# Patient Record
Sex: Male | Born: 1935 | Race: White | Hispanic: No | Marital: Married | Smoking: Former smoker
Health system: Southern US, Community
[De-identification: ages and names within clinical notes are randomized; demographics above are authoritative.]

## PROBLEM LIST (undated history)

## (undated) DIAGNOSIS — J9600 Acute respiratory failure, unspecified whether with hypoxia or hypercapnia: Secondary | ICD-10-CM

## (undated) DIAGNOSIS — A449 Bartonellosis, unspecified: Secondary | ICD-10-CM

## (undated) DIAGNOSIS — R739 Hyperglycemia, unspecified: Secondary | ICD-10-CM

## (undated) DIAGNOSIS — H3530 Unspecified macular degeneration: Secondary | ICD-10-CM

## (undated) DIAGNOSIS — Z8546 Personal history of malignant neoplasm of prostate: Secondary | ICD-10-CM

## (undated) DIAGNOSIS — G20 Parkinson's disease: Secondary | ICD-10-CM

## (undated) DIAGNOSIS — M4800 Spinal stenosis, site unspecified: Secondary | ICD-10-CM

## (undated) DIAGNOSIS — C801 Malignant (primary) neoplasm, unspecified: Secondary | ICD-10-CM

## (undated) HISTORY — DX: Hyperglycemia, unspecified: R73.9

## (undated) HISTORY — DX: Spinal stenosis, site unspecified: M48.00

## (undated) HISTORY — PX: TONSILLECTOMY: SUR1361

## (undated) HISTORY — DX: Personal history of malignant neoplasm of prostate: Z85.46

## (undated) HISTORY — DX: Bartonellosis, unspecified: A44.9

## (undated) HISTORY — DX: Unspecified macular degeneration: H35.30

## (undated) HISTORY — PX: APPENDECTOMY: SHX54

---

## 1996-11-27 HISTORY — PX: PROSTATE SURGERY: SHX751

## 1999-07-22 ENCOUNTER — Ambulatory Visit (HOSPITAL_COMMUNITY): Admission: RE | Admit: 1999-07-22 | Discharge: 1999-07-22 | Payer: Self-pay | Admitting: Gastroenterology

## 2002-12-03 ENCOUNTER — Encounter (INDEPENDENT_AMBULATORY_CARE_PROVIDER_SITE_OTHER): Payer: Self-pay | Admitting: *Deleted

## 2002-12-03 ENCOUNTER — Ambulatory Visit (HOSPITAL_COMMUNITY): Admission: RE | Admit: 2002-12-03 | Discharge: 2002-12-03 | Payer: Self-pay | Admitting: Gastroenterology

## 2005-03-10 ENCOUNTER — Ambulatory Visit (HOSPITAL_COMMUNITY): Admission: RE | Admit: 2005-03-10 | Discharge: 2005-03-10 | Payer: Self-pay | Admitting: *Deleted

## 2005-08-25 ENCOUNTER — Encounter: Admission: RE | Admit: 2005-08-25 | Discharge: 2005-08-25 | Payer: Self-pay | Admitting: Family Medicine

## 2005-08-30 ENCOUNTER — Encounter: Admission: RE | Admit: 2005-08-30 | Discharge: 2005-08-30 | Payer: Self-pay | Admitting: Family Medicine

## 2005-09-08 ENCOUNTER — Encounter: Admission: RE | Admit: 2005-09-08 | Discharge: 2005-09-08 | Payer: Self-pay | Admitting: Family Medicine

## 2008-01-16 ENCOUNTER — Encounter (INDEPENDENT_AMBULATORY_CARE_PROVIDER_SITE_OTHER): Payer: Self-pay | Admitting: Gastroenterology

## 2008-01-16 ENCOUNTER — Ambulatory Visit (HOSPITAL_COMMUNITY): Admission: RE | Admit: 2008-01-16 | Discharge: 2008-01-16 | Payer: Self-pay | Admitting: Gastroenterology

## 2009-07-08 ENCOUNTER — Encounter: Admission: RE | Admit: 2009-07-08 | Discharge: 2009-07-08 | Payer: Self-pay | Admitting: Neurology

## 2010-07-23 ENCOUNTER — Encounter: Admission: RE | Admit: 2010-07-23 | Discharge: 2010-07-23 | Payer: Self-pay | Admitting: Family Medicine

## 2010-12-26 ENCOUNTER — Other Ambulatory Visit: Payer: Self-pay | Admitting: Neurology

## 2010-12-26 DIAGNOSIS — M5410 Radiculopathy, site unspecified: Secondary | ICD-10-CM

## 2010-12-29 ENCOUNTER — Ambulatory Visit
Admission: RE | Admit: 2010-12-29 | Discharge: 2010-12-29 | Disposition: A | Discharge status: Home / Self Care | Payer: Medicare Other | Source: Ambulatory Visit | Attending: Neurology | Admitting: Neurology

## 2010-12-29 DIAGNOSIS — M5410 Radiculopathy, site unspecified: Secondary | ICD-10-CM

## 2011-01-06 ENCOUNTER — Other Ambulatory Visit: Payer: Self-pay | Admitting: Neurology

## 2011-01-06 DIAGNOSIS — M5410 Radiculopathy, site unspecified: Secondary | ICD-10-CM

## 2011-01-11 ENCOUNTER — Ambulatory Visit
Admission: RE | Admit: 2011-01-11 | Discharge: 2011-01-11 | Disposition: A | Discharge status: Home / Self Care | Payer: Medicare Other | Source: Ambulatory Visit | Attending: Neurology | Admitting: Neurology

## 2011-01-11 DIAGNOSIS — M5410 Radiculopathy, site unspecified: Secondary | ICD-10-CM

## 2011-04-25 ENCOUNTER — Encounter: Payer: Medicare Other | Admitting: *Deleted

## 2011-05-02 ENCOUNTER — Other Ambulatory Visit: Payer: Self-pay | Admitting: Neurology

## 2011-05-02 DIAGNOSIS — M79609 Pain in unspecified limb: Secondary | ICD-10-CM

## 2011-05-03 ENCOUNTER — Encounter: Payer: Medicare Other | Admitting: *Deleted

## 2011-05-03 ENCOUNTER — Encounter (INDEPENDENT_AMBULATORY_CARE_PROVIDER_SITE_OTHER): Payer: Medicare Other | Admitting: *Deleted

## 2011-05-03 ENCOUNTER — Encounter: Payer: Medicare Other | Admitting: Cardiology

## 2011-05-03 DIAGNOSIS — M79609 Pain in unspecified limb: Secondary | ICD-10-CM

## 2011-05-03 DIAGNOSIS — I739 Peripheral vascular disease, unspecified: Secondary | ICD-10-CM

## 2011-05-08 ENCOUNTER — Encounter: Payer: Self-pay | Admitting: Neurology

## 2011-08-03 ENCOUNTER — Other Ambulatory Visit: Payer: Self-pay | Admitting: Dermatology

## 2011-08-29 ENCOUNTER — Other Ambulatory Visit: Payer: Self-pay | Admitting: Dermatology

## 2011-12-07 ENCOUNTER — Other Ambulatory Visit: Payer: Self-pay | Admitting: Dermatology

## 2011-12-21 ENCOUNTER — Encounter: Payer: Self-pay | Admitting: Cardiovascular Disease

## 2012-02-15 ENCOUNTER — Telehealth: Payer: Self-pay | Admitting: *Deleted

## 2012-02-15 NOTE — Telephone Encounter (Signed)
Raymond Valdez from [facility] called to find out when his appt here will be. They had referred him. I gave her the date & time

## 2012-02-20 ENCOUNTER — Encounter: Payer: Self-pay | Admitting: Internal Medicine

## 2012-02-20 ENCOUNTER — Ambulatory Visit (INDEPENDENT_AMBULATORY_CARE_PROVIDER_SITE_OTHER): Payer: Medicare Other | Admitting: Internal Medicine

## 2012-02-20 VITALS — BP 146/81 | HR 77 | Temp 97.7°F | Wt 190.0 lb

## 2012-02-20 DIAGNOSIS — M4800 Spinal stenosis, site unspecified: Secondary | ICD-10-CM

## 2012-02-20 DIAGNOSIS — Z8546 Personal history of malignant neoplasm of prostate: Secondary | ICD-10-CM

## 2012-02-20 DIAGNOSIS — A449 Bartonellosis, unspecified: Secondary | ICD-10-CM

## 2012-02-20 DIAGNOSIS — H3530 Unspecified macular degeneration: Secondary | ICD-10-CM

## 2012-02-20 LAB — COMPREHENSIVE METABOLIC PANEL
ALT: 23 U/L (ref 0–53)
AST: 15 U/L (ref 0–37)
Albumin: 4 g/dL (ref 3.5–5.2)
Alkaline Phosphatase: 58 U/L (ref 39–117)
BUN: 21 mg/dL (ref 6–23)
CO2: 29 mEq/L (ref 19–32)
Calcium: 8.9 mg/dL (ref 8.4–10.5)
Chloride: 104 mEq/L (ref 96–112)
Creat: 0.95 mg/dL (ref 0.50–1.35)
Glucose, Bld: 104 mg/dL — ABNORMAL HIGH (ref 70–99)
Potassium: 4.4 mEq/L (ref 3.5–5.3)
Sodium: 142 mEq/L (ref 135–145)
Total Bilirubin: 0.6 mg/dL (ref 0.3–1.2)
Total Protein: 6.3 g/dL (ref 6.0–8.3)

## 2012-02-20 LAB — CBC WITH DIFFERENTIAL/PLATELET
Basophils Absolute: 0 K/uL (ref 0.0–0.1)
Basophils Relative: 0 % (ref 0–1)
Eosinophils Absolute: 0.1 K/uL (ref 0.0–0.7)
Eosinophils Relative: 2 % (ref 0–5)
HCT: 47.7 % (ref 39.0–52.0)
Hemoglobin: 15.6 g/dL (ref 13.0–17.0)
Lymphocytes Relative: 25 % (ref 12–46)
Lymphs Abs: 2 K/uL (ref 0.7–4.0)
MCH: 31.3 pg (ref 26.0–34.0)
MCHC: 32.7 g/dL (ref 30.0–36.0)
MCV: 95.6 fL (ref 78.0–100.0)
Monocytes Absolute: 0.4 K/uL (ref 0.1–1.0)
Monocytes Relative: 5 % (ref 3–12)
Neutro Abs: 5.2 K/uL (ref 1.7–7.7)
Neutrophils Relative %: 68 % (ref 43–77)
Platelets: 134 K/uL — ABNORMAL LOW (ref 150–400)
RBC: 4.99 MIL/uL (ref 4.22–5.81)
RDW: 13.8 % (ref 11.5–15.5)
WBC: 7.8 K/uL (ref 4.0–10.5)

## 2012-02-21 ENCOUNTER — Encounter: Payer: Self-pay | Admitting: Internal Medicine

## 2012-02-21 DIAGNOSIS — H3530 Unspecified macular degeneration: Secondary | ICD-10-CM | POA: Insufficient documentation

## 2012-02-21 DIAGNOSIS — A449 Bartonellosis, unspecified: Secondary | ICD-10-CM | POA: Insufficient documentation

## 2012-02-21 DIAGNOSIS — M4800 Spinal stenosis, site unspecified: Secondary | ICD-10-CM | POA: Insufficient documentation

## 2012-02-21 DIAGNOSIS — Z8546 Personal history of malignant neoplasm of prostate: Secondary | ICD-10-CM | POA: Insufficient documentation

## 2012-02-21 LAB — BARTONELLA ANTIBODY PANEL
B henselae IgG: NEGATIVE
B henselae IgM: NEGATIVE

## 2012-02-21 LAB — BARTONELLA ANITBODY PANEL

## 2012-02-21 NOTE — Progress Notes (Signed)
INFECTIOUS DISEASES CLINIC NOTE  RFV: initial visit, bartonellosis  Subjective:    Patient ID: Raymond Valdez, male    DOB: Apr 23, 1929, 78 y.o.   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 roughly 2 yrs ago, after having a tick bit to his left wrist. The patient initially had a sizeable local reaction to a tick bite that dissipated spontaneously. He subsequently started to have arthralgia, muscle cramps weeks after his tick bite exposure.He denied having target lesion, EM like rash. He was tested for lyme disease, and bartonellosis by a fellow veterinarian, Dr. Lanora Manis, who does research in bartonella, and found that the patient had bartonella vinsonii berkhoffii, and negative for borrelia burgdorferi.The patient reports that his titers were  Elevated at 1:152, per patient report. He subsequently received various courses of antibiotics, including doxycycline, a type of "mycin", rifampin and a fluoroquinolone by his former PCP ,Dara Hoyer. After receiving treatment, he had follow up testing (blood work on 2/23, 2/24, and 2/25) which were" negative for bartonella by the most sensitive diagnostic testing available through Dr. Breitschwerdt's lab, unclear if it was PCR vs. IFA.  Since being treated over 2 yrs ago, he feels that his health is slowly declining. He is having worsening peripheral neuropathy with numbness to his feet, difficulty walking; ongoing fatigue, and some memory difficulty. The patient is worried that he may still have some sequelae associated with bartonellosis, that would require further treatment. He denies fever/chills/nightsweat/weight loss, but does have a rash that has persisted on his torso, predominantly his chest for over the last 2 months.   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   Past med history: Prostate ca s/p prostatectomy s/p radiation Pre-melanoma skin lesion s/p removal Spinal stenosis Peripheral neuropathy, followed by [facility] Age related macular degeneration  Social history = lives in [place] for greater than [place]yrs. Married. Non-smoker, no alcohol. He uses cane to ambulate/ aid with his peripheral neuropathy. He has 2 dogs, 1 older cat, and 2 parrots-- all in good health.  Family history= reviewed, non-contributory. His father had AV replacement at age of 96, lived to age of 56.   Review of Systems Review of Systems  Constitutional: Negative for fever, chills, diaphoresis, activity change, appetite change, and unexpected weight change. postitve for fatigue HENT: Negative for congestion, sore throat, rhinorrhea, sneezing, trouble swallowing and sinus pressure.  Eyes: Negative for photophobia and visual disturbance. He does suffer from age related macular degeneration Respiratory: Negative for cough, chest tightness, shortness of breath, wheezing and stridor.  Cardiovascular: Negative for chest pain, palpitations and leg swelling.  Gastrointestinal: Negative for nausea, vomiting, abdominal pain, diarrhea, constipation, blood in stool, abdominal distention and anal bleeding.  Genitourinary: Negative for dysuria, hematuria, flank pain and difficulty urinating.  Musculoskeletal: Negative for myalgias, joint swelling, arthralgias. He has gait difficulty due to peripheral neuropathy. occ back pain due to spinal stenosis. Skin: Negative for color change, pallor, rash and wound.  Neurological: Negative for dizziness, tremors, and light-headedness. Mild weakness due to peripheral neuropathy of lower extremities. Numbness/dull ache to lower extremities Hematological: Negative for adenopathy. Does not bruise/bleed easily.    Psychiatric/Behavioral: Negative for behavioral problems, confusion, sleep disturbance, dysphoric mood, decreased concentration  and agitation.       Objective:   Physical Exam BP 146/81  Pulse 77  Temp(Src) 97.7 F (36.5 C) (Oral)  Wt 190 lb (86.183 kg)  General Appearance:    Alert, cooperative, no distress, appears stated age  Head:    Normocephalic, without obvious abnormality, atraumatic  Eyes:    PERRL, conjunctiva/corneas clear, EOM's intact, fundi    benign, both eyes       Ears:    Normal TM's and external ear canals, both ears  Nose:   Nares normal, septum midline, mucosa normal, no drainage   or sinus tenderness  Throat:   Lips, mucosa, and tongue normal; teeth and gums normal  Neck:   Supple, symmetrical, trachea midline, no adenopathy;   Decrease flexion due to spinal stenosis.      Back:     Symmetric, no curvature, ROM normal, no CVA tenderness  Lungs:     Clear to auscultation bilaterally, respirations unlabored  Chest wall:    No tenderness or deformity  Heart:    Regular rate and rhythm, S1 and S2 normal, no murmur, rub   or gallop  Abdomen:     Soft, non-tender, bowel sounds active all four quadrants,    no masses, no hepatomegaly. Possible palpable splenic tip noted below last rib        Extremities:   Extremities normal, atraumatic, no cyanosis or edema  Pulses:   2+ and symmetric all extremities  Skin:   Nonblanching, eryethamatous rash, discreet submm macular lesions, on back and chest. Non-erythematous.  Lymph nodes:   Cervical, supraclavicular, and axillary nodes normal  Neurologic:   CNII-XII intact. Normal strength, sensation and reflexes      throughout         Assessment & Plan:  History of bartonella infection =  --will track down records from PCP.  --Will check cbc with diff, cmp, and bartonella titers. --If they are positive, then will track down records to see what treatment he has received. -- If the tests are negative, we will not  pursue other testing, since it appears that his last set of test in Feb 2011, were negative.   His constellation of symptoms of peripheral neuropathy, memory loss, and fatigue, can often be multifactorial. It appears by report that the patient had adequate treatment for bartonella. If his tests remain positive, we will track down his old records to ensure he had adequate therapy. Presently, I have low suspicion that the patient still has ongoing chronic illness associated with bartonella. He does not have any systemic symptoms of fever/chills/nighstweats to warrant blood culture work up. We will check for bartonella titers to see if he may have had exposure. He reports having bartonella vinsonii. I will check to see if any cross reaction with b.henselae or B.quintana which is the battery that is first checked. Bartonella vinsonii may only be available via research labs. Per report, he was negative based on 3 blood tests in Feb 2011.  Peripheral neuropathy= patient reports being followed by [facility]. Has had abn nerve conduction study, and presumably idiopathic etiology. Currently not on any medications but has tried numerous agents without significant improvement  Memory loss= this,too, is also being followed by [facility]. Patient appears to have good recall of these events over the last 2 yrs. Given his age, I would not be surprised if he does have some age related cognitive disorder. I will defer to his PCP, if the patient warrants neurocognitive testing.  Will contact patient within 2 wks to let him know of test results and what will be next steps.   pcp francis brown at [facility].

## 2012-03-21 ENCOUNTER — Other Ambulatory Visit: Payer: Self-pay | Admitting: Family Medicine

## 2012-03-21 DIAGNOSIS — R600 Localized edema: Secondary | ICD-10-CM

## 2012-03-26 ENCOUNTER — Ambulatory Visit
Admission: RE | Admit: 2012-03-26 | Discharge: 2012-03-26 | Disposition: A | Discharge status: Home / Self Care | Payer: Medicare Other | Source: Ambulatory Visit | Attending: Family Medicine | Admitting: Family Medicine

## 2012-03-26 DIAGNOSIS — R600 Localized edema: Secondary | ICD-10-CM

## 2012-04-05 ENCOUNTER — Encounter: Payer: Self-pay | Admitting: *Deleted

## 2012-04-08 ENCOUNTER — Encounter: Payer: Self-pay | Admitting: Cardiovascular Disease

## 2012-04-08 ENCOUNTER — Ambulatory Visit (INDEPENDENT_AMBULATORY_CARE_PROVIDER_SITE_OTHER): Payer: BLUE CROSS/BLUE SHIELD | Admitting: Cardiovascular Disease

## 2012-04-08 VITALS — BP 118/76 | HR 69 | Wt 189.0 lb

## 2012-04-08 DIAGNOSIS — I4949 Other premature depolarization: Secondary | ICD-10-CM

## 2012-04-08 DIAGNOSIS — I493 Ventricular premature depolarization: Secondary | ICD-10-CM | POA: Insufficient documentation

## 2012-04-08 DIAGNOSIS — H3530 Unspecified macular degeneration: Secondary | ICD-10-CM

## 2012-04-08 DIAGNOSIS — R609 Edema, unspecified: Secondary | ICD-10-CM | POA: Insufficient documentation

## 2012-04-08 NOTE — Assessment & Plan Note (Signed)
Avastin intraocular ok BP not too high

## 2012-04-08 NOTE — Progress Notes (Signed)
Patient ID: Raymond Valdez, male   DOB: [birth year], 78 y.o.   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  Chronic LE edema recently worse.  Had occular avastin RX for retinal problems.  Concerned with CHF.  No previous cardiac problems.  Appearst to have progressive neurologic slowing with tardokinesia and sees Dr Sandria Manly.  Wife of 57 years also notes more shuffling gate Denies chest pain, PND or orthopnea.  No palpitations or syncope.  Has had prostatectomy but no obvious chronic lymphadenopathy.  ECG done in primary office showed one PVC  No clinical arrhythmia.  Compliant with meds for HTN.  No history of renal failure and proteinuria not checked for  ROS: Denies fever, malais, weight loss, blurry vision, decreased visual acuity, cough, sputum, SOB, hemoptysis, pleuritic pain, palpitaitons, heartburn, abdominal pain, melena, lower extremity edema, claudication, or rash.  All other systems reviewed and negative   General: Affect appropriate Elderly kyphotic male HEENT: normal Neck supple with no adenopathy JVP normal no bruits no thyromegaly Lungs clear with no wheezing and good diaphragmatic motion Heart:  S1/S2 no murmur,rub, gallop or click PMI normal Abdomen: benighn, BS positve, no tenderness, no AAA no bruit.  No HSM or HJR Distal pulses intact with no bruits Bilateral varicosities with mild  edema Neuro non-focal but tardokinesia  Skin warm and dry No muscular weakness  Medications Current Outpatient Prescriptions  Medication Sig Dispense Refill  . amLODipine (NORVASC) 2.5 MG tablet Take 1 tablet by mouth Daily.      . beta carotene w/minerals (OCUVITE) tablet Take 1 tablet by mouth daily.      Marland Kitchen doxycycline (VIBRAMYCIN) 100 MG capsule Take 1 tablet by mouth BID times 48H.      . furosemide (LASIX) 20 MG tablet Take 1 tablet by mouth Daily.      Marland Kitchen KLOR-CON M20 20 MEQ tablet Take 1 tablet by mouth as needed.      . Omega-3 Fatty Acids (FISH OIL PO) Take 1 tablet by  mouth daily.        Allergies Sulfa antibiotics and Sulfa drugs cross reactors  Family History: No family history on file.  Social History: History   Social History  . Marital Status: Married    Spouse Name: N/A    Number of Children: N/A  . Years of Education: N/A   Occupational History  . Not on file.   Social History Main Topics  . Smoking status: Former Smoker    Quit date: 11/27/1970  . Smokeless tobacco: Never Used  . Alcohol Use: No  . Drug Use: No  . Sexually Active: Yes -- Male partner(s)   Other Topics Concern  . Not on file   Social History Narrative  . No narrative on file    Electrocardiogram:  Assessment and Plan

## 2012-04-08 NOTE — Assessment & Plan Note (Signed)
Benign and asymptomatic.  In setting of avastin and edema with check echo to assess RV/LV functoin

## 2012-04-08 NOTE — Patient Instructions (Signed)
Your physician recommends that you schedule a follow-up appointment in: AS NEEDED Your physician recommends that you continue on your current medications as directed. Please refer to the Current Medication list given to you today. Your physician has requested that you have an echocardiogram. Echocardiography is a painless test that uses sound waves to create images of your heart. It provides your doctor with information about the size and shape of your heart and how well your heart's chambers and valves are working. This procedure takes approximately one hour. There are no restrictions for this procedure. DX EDEMA Your physician has requested that you have a lower or upper extremity venous duplex. This test is an ultrasound of the veins in the legs or arms. It looks at venous blood flow that carries blood from the heart to the legs or arms. Allow one hour for a Lower Venous exam. Allow thirty minutes for an Upper Venous exam. There are no restrictions or special instructions. LOWER  EXTREMITIES  DX EDEMA

## 2012-04-08 NOTE — Assessment & Plan Note (Signed)
Likely related to venous insuf. And previous prostate surgery.  F/U US venous duplex.  Continue diuretic and consider support hose

## 2012-04-11 ENCOUNTER — Ambulatory Visit (HOSPITAL_COMMUNITY): Payer: Medicare Other | Attending: Cardiology

## 2012-04-11 ENCOUNTER — Other Ambulatory Visit: Payer: Self-pay

## 2012-04-11 DIAGNOSIS — R609 Edema, unspecified: Secondary | ICD-10-CM

## 2012-05-03 ENCOUNTER — Encounter (INDEPENDENT_AMBULATORY_CARE_PROVIDER_SITE_OTHER): Payer: Medicare Other

## 2012-05-03 DIAGNOSIS — R229 Localized swelling, mass and lump, unspecified: Secondary | ICD-10-CM

## 2012-05-03 DIAGNOSIS — R609 Edema, unspecified: Secondary | ICD-10-CM

## 2012-05-27 DIAGNOSIS — G20 Parkinson's disease: Secondary | ICD-10-CM

## 2012-05-27 DIAGNOSIS — G20A1 Parkinson's disease without dyskinesia, without mention of fluctuations: Secondary | ICD-10-CM | POA: Insufficient documentation

## 2012-05-27 HISTORY — DX: Parkinson's disease without dyskinesia, without mention of fluctuations: G20.A1

## 2012-05-27 HISTORY — DX: Parkinson's disease: G20

## 2012-06-10 ENCOUNTER — Encounter (HOSPITAL_COMMUNITY): Payer: Self-pay | Admitting: Emergency Medicine

## 2012-06-10 ENCOUNTER — Inpatient Hospital Stay (HOSPITAL_COMMUNITY)
Admission: EM | Admit: 2012-06-10 | Discharge: 2012-06-15 | DRG: 551 | Disposition: A | Discharge status: Other Acute Inpatient Hospital | Payer: Medicare Other | Attending: Internal Medicine | Admitting: Internal Medicine

## 2012-06-10 ENCOUNTER — Inpatient Hospital Stay (HOSPITAL_COMMUNITY): Payer: Medicare Other

## 2012-06-10 ENCOUNTER — Emergency Department (HOSPITAL_COMMUNITY): Payer: Medicare Other

## 2012-06-10 DIAGNOSIS — M47812 Spondylosis without myelopathy or radiculopathy, cervical region: Secondary | ICD-10-CM | POA: Diagnosis present

## 2012-06-10 DIAGNOSIS — A449 Bartonellosis, unspecified: Secondary | ICD-10-CM | POA: Diagnosis present

## 2012-06-10 DIAGNOSIS — R29898 Other symptoms and signs involving the musculoskeletal system: Secondary | ICD-10-CM | POA: Diagnosis present

## 2012-06-10 DIAGNOSIS — IMO0002 Reserved for concepts with insufficient information to code with codable children: Secondary | ICD-10-CM | POA: Diagnosis present

## 2012-06-10 DIAGNOSIS — G629 Polyneuropathy, unspecified: Secondary | ICD-10-CM | POA: Diagnosis present

## 2012-06-10 DIAGNOSIS — R5381 Other malaise: Secondary | ICD-10-CM

## 2012-06-10 DIAGNOSIS — R531 Weakness: Secondary | ICD-10-CM

## 2012-06-10 DIAGNOSIS — G92 Toxic encephalopathy: Secondary | ICD-10-CM | POA: Diagnosis not present

## 2012-06-10 DIAGNOSIS — M4800 Spinal stenosis, site unspecified: Secondary | ICD-10-CM | POA: Diagnosis present

## 2012-06-10 DIAGNOSIS — G929 Unspecified toxic encephalopathy: Secondary | ICD-10-CM | POA: Diagnosis not present

## 2012-06-10 DIAGNOSIS — R609 Edema, unspecified: Secondary | ICD-10-CM

## 2012-06-10 DIAGNOSIS — Z8546 Personal history of malignant neoplasm of prostate: Secondary | ICD-10-CM

## 2012-06-10 DIAGNOSIS — M48061 Spinal stenosis, lumbar region without neurogenic claudication: Principal | ICD-10-CM | POA: Diagnosis present

## 2012-06-10 DIAGNOSIS — G9340 Encephalopathy, unspecified: Secondary | ICD-10-CM | POA: Diagnosis present

## 2012-06-10 DIAGNOSIS — G609 Hereditary and idiopathic neuropathy, unspecified: Secondary | ICD-10-CM | POA: Diagnosis present

## 2012-06-10 LAB — APTT: aPTT: 36 seconds (ref 24–37)

## 2012-06-10 LAB — URINALYSIS, ROUTINE W REFLEX MICROSCOPIC
Protein, ur: NEGATIVE mg/dL
Specific Gravity, Urine: 1.029 (ref 1.005–1.030)
pH: 5.5 (ref 5.0–8.0)

## 2012-06-10 LAB — CBC WITH DIFFERENTIAL/PLATELET
Basophils Absolute: 0 10*3/uL (ref 0.0–0.1)
Basophils Relative: 0 % (ref 0–1)
Eosinophils Relative: 0 % (ref 0–5)
HCT: 40.9 % (ref 39.0–52.0)
Lymphocytes Relative: 11 % — ABNORMAL LOW (ref 12–46)
MCHC: 34.5 g/dL (ref 30.0–36.0)
Monocytes Absolute: 1.3 10*3/uL — ABNORMAL HIGH (ref 0.1–1.0)
Neutro Abs: 10.6 10*3/uL — ABNORMAL HIGH (ref 1.7–7.7)
Platelets: 140 10*3/uL — ABNORMAL LOW (ref 150–400)
RDW: 14.3 % (ref 11.5–15.5)
WBC: 13.4 10*3/uL — ABNORMAL HIGH (ref 4.0–10.5)

## 2012-06-10 LAB — COMPREHENSIVE METABOLIC PANEL
CO2: 27 mEq/L (ref 19–32)
Calcium: 9 mg/dL (ref 8.4–10.5)
Creatinine, Ser: 0.73 mg/dL (ref 0.50–1.35)
GFR calc Af Amer: >90 mL/min (ref >90)
GFR calc non Af Amer: 85 mL/min — ABNORMAL LOW (ref >90)
Glucose, Bld: 101 mg/dL — ABNORMAL HIGH (ref 70–99)
Total Protein: 6.7 g/dL (ref 6.0–8.3)

## 2012-06-10 LAB — PROTIME-INR: INR: 1.22 (ref 0.00–1.49)

## 2012-06-10 MED ORDER — SODIUM CHLORIDE 0.9 % IJ SOLN
3.0000 mL | Freq: Two times a day (BID) | INTRAMUSCULAR | Status: DC
  Administered 2012-06-10: 3 mL via INTRAVENOUS
  Administered 2012-06-11: 10 mL via INTRAVENOUS
  Administered 2012-06-11 – 2012-06-14 (×7): 3 mL via INTRAVENOUS

## 2012-06-10 MED ORDER — SODIUM CHLORIDE 0.9 % IV SOLN: 250.0000 mL | INTRAVENOUS | Status: DC | PRN

## 2012-06-10 MED ORDER — ONDANSETRON HCL 4 MG/2ML IJ SOLN: 4.0000 mg | Freq: Four times a day (QID) | INTRAMUSCULAR | Status: DC | PRN

## 2012-06-10 MED ORDER — IOHEXOL 300 MG/ML  SOLN
100.0000 mL | Freq: Once | INTRAMUSCULAR | Status: AC | PRN
  Administered 2012-06-10: 100 mL via INTRAVENOUS

## 2012-06-10 MED ORDER — HYDROCODONE-ACETAMINOPHEN 5-325 MG PO TABS: 1.0000 | ORAL_TABLET | ORAL | Status: DC | PRN

## 2012-06-10 MED ORDER — ONDANSETRON HCL 4 MG PO TABS: 4.0000 mg | ORAL_TABLET | Freq: Four times a day (QID) | ORAL | Status: DC | PRN

## 2012-06-10 MED ORDER — OCUVITE PO TABS
1.0000 | ORAL_TABLET | Freq: Every day | ORAL | Status: DC
  Administered 2012-06-10 – 2012-06-14 (×5): 1 via ORAL
  Filled 2012-06-10 (×5): qty 1

## 2012-06-10 MED ORDER — DOCUSATE SODIUM 100 MG PO CAPS
100.0000 mg | ORAL_CAPSULE | Freq: Every day | ORAL | Status: DC | PRN
  Filled 2012-06-10: qty 1

## 2012-06-10 NOTE — ED Notes (Signed)
RN acknowledge vitals 

## 2012-06-10 NOTE — ED Provider Notes (Signed)
History     CSN: [id number]  Arrival date & time 06/10/12  1010   First MD Initiated Contact with Patient 06/10/12 1017       Chief Complaint  Patient presents with  . Fatigue  . Rash    (Consider location/radiation/quality/duration/timing/severity/associated sxs/prior treatment) The history is provided by the patient and the spouse.    78 y/o  Male iNAD c/o worsening generalized weakness over the last several months with increase in the last 24 hours to the point where he cannot ambulate independently. Pt also reports painless, non pruritic rash to torso x 1 year. Pt was diagnosed with an intracellular spp. bartonella vinsonii berkhoffii x2 years ago. Pt was treated with doxy in the last x4 weeks. Pt reports right knee pain which is chronic denies any other pain. Pt feels his current exacerbation is a worsening of this infection. Patient denies chest pain, palpitations, shortness of breath, abdominal pain, fever, change in bowel or bladder habits, HA, neck pain/stiffness.   Past Medical History  Diagnosis Date  . Macular degeneration   . History of prostate cancer   . Infection, bartonella   . Spinal stenosis     Past Surgical History  Procedure Date  . Appendectomy   . Tonsillectomy     History reviewed. No pertinent family history.  History  Substance Use Topics  . Smoking status: Former Smoker    Quit date: 11/27/1970  . Smokeless tobacco: Never Used  . Alcohol Use: No      Review of Systems  Constitutional: Positive for activity change and fatigue. Negative for fever.  HENT: Negative for congestion and rhinorrhea.   Eyes: Negative for visual disturbance.  Cardiovascular: Positive for leg swelling. Negative for chest pain and palpitations.       Chronic dependent edema  Musculoskeletal: Positive for arthralgias.       Right knee pain is chronic  Skin: Positive for rash.  Neurological: Negative for facial asymmetry and numbness.  All other systems reviewed  and are negative.    Allergies  Sulfa antibiotics and Sulfa drugs cross reactors  Home Medications   Current Outpatient Rx  Name Route Sig Dispense Refill  . OCUVITE PO TABS Oral Take 1 tablet by mouth daily.    . FUROSEMIDE 20 MG PO TABS Oral Take 40 mg by mouth Daily.     Marland Kitchen KLOR-CON M20 20 MEQ PO TBCR Oral Take 1 tablet by mouth as needed.    Marland Kitchen FISH OIL PO Oral Take 1 tablet by mouth daily.      BP 116/63  Pulse 90  Temp 98.5 F (36.9 C) (Oral)  Resp 16  SpO2 100%  Physical Exam  Nursing note and vitals reviewed. Constitutional: He is oriented to person, place, and time. He appears well-developed and well-nourished. No distress.  HENT:  Head: Normocephalic and atraumatic.  Right Ear: External ear normal.  Left Ear: External ear normal.  Nose: Nose normal.  Mouth/Throat: Oropharynx is clear and moist.  Eyes: Conjunctivae and EOM are normal. Pupils are equal, round, and reactive to light.  Neck: Normal range of motion. Neck supple. No JVD present. No tracheal deviation present.  Cardiovascular: Normal rate, regular rhythm, normal heart sounds and intact distal pulses.   Pulmonary/Chest: Effort normal and breath sounds normal. No respiratory distress. He has no wheezes. He has no rales. He exhibits no tenderness.  Abdominal: Soft. Bowel sounds are normal. He exhibits no distension and no mass. There is no tenderness. There is no  rebound and no guarding.  Musculoskeletal: He exhibits edema.       1+ pitting edema to bilateral ankles. Joint swelling to bilateral knees, no warmth or ballotable effusion.   Neurological: He is alert and oriented to person, place, and time.       CN 3- 12 intact. Neuro exam is non-focal. Is diminished to 4/5 x4 extremities. Pt has intention tremor x4  Skin: Rash noted. He is not diaphoretic.       Macular rash to anterior and posterior torso.   Psychiatric: He has a normal mood and affect.       Pt is A and O x 3 but seems to to lose his train  of thought mid sentence.     ED Course  Procedures (including critical care time)  Labs Reviewed  CBC WITH DIFFERENTIAL - Abnormal; Notable for the following:    WBC 13.4 (*)     Platelets 140 (*)     Neutrophils Relative 79 (*)     Neutro Abs 10.6 (*)     Lymphocytes Relative 11 (*)     Monocytes Absolute 1.3 (*)     All other components within normal limits  PROTIME-INR - Abnormal; Notable for the following:    Prothrombin Time 15.7 (*)     All other components within normal limits  COMPREHENSIVE METABOLIC PANEL - Abnormal; Notable for the following:    Glucose, Bld 101 (*)     BUN 25 (*)     Albumin 2.8 (*)     AST 70 (*)  ICTERIC SPECIMEN   Total Bilirubin 1.3 (*)     GFR calc non Af Amer 85 (*)     All other components within normal limits  URINALYSIS, ROUTINE W REFLEX MICROSCOPIC - Abnormal; Notable for the following:    Color, Urine AMBER (*)  BIOCHEMICALS MAY BE AFFECTED BY COLOR   APPearance CLOUDY (*)     Ketones, ur TRACE (*)     All other components within normal limits  APTT  LACTIC ACID, PLASMA   Dg Chest 2 View  06/10/2012  *RADIOLOGY REPORT*  Clinical Data: Chest pain.  CHEST - 2 VIEW  Comparison: None.  Findings: Calcified lymph nodes more notable right hilar region suggestive of prior granulomas exposure.  Left base subsegmental atelectasis/scarring suspected.  No infiltrate, congestive heart failure or pneumothorax.  Calcified tortuous aorta.  Heart size within normal limits.  Degenerative changes thoracic spine with mild loss of height lower thoracic vertebra.  This is incompletely assessed on the present exam.  IMPRESSION: Calcified lymph nodes more notable right hilar region suggestive of prior granulomas exposure.  Left base subsegmental atelectasis/scarring suspected.  No infiltrate, congestive heart failure or pneumothorax.  Calcified tortuous aorta.  Original Report Authenticated By: Fuller Canada, M.D.     Date: 06/10/2012  Rate: 88  Rhythm: normal  sinus rhythm  QRS Axis: normal  Intervals: normal  ST/T Wave abnormalities: nonspecific T wave changes  Conduction Disutrbances:right bundle branch block  Narrative Interpretation: PVCs  Old EKG Reviewed: none available    1. Weakness generalized       MDM  78 y/o male with rapidly worsening weakness to the level that he is not able to walk ovr the last 24 hours. Phisocal exam shows diffuse weakness, and jiont swelling. Neuro exam is non-focal with no meningismus noted. CXR, EKG, Blood work and UA show no gross abnormalities.   Infectious disease consult by Dr. Joylene Grapes appreciated who will evaluate  the patient and do not recommend starting antibiotics at this time.   Pt will be admitted for generalized weakness.  Will be admitted to [facility] under the care of Dr. Cena Benton to med/surg. bed.        Wynetta Emery, PA-C 06/10/12 1415

## 2012-06-10 NOTE — Progress Notes (Signed)
Report rec'd from ED RN Dorothyann Peng.  Pt coming to rm [id number].

## 2012-06-10 NOTE — ED Notes (Signed)
ZOX:[id number]<AV> Expected date:06/10/12<BR> Expected time: 9:52 AM<BR> Means of arrival:Ambulance<BR> Comments:<BR> 78yo/m not feeling well

## 2012-06-10 NOTE — ED Notes (Signed)
Patient aware of need for urine specimen. Patient unable to void at this time. Patient given urinal. Encouraged to call for assistance if needed.   

## 2012-06-10 NOTE — Consult Note (Signed)
Date of Admission:  06/10/2012  Date of Consult:  06/10/2012  Reason for Consult: Weakness, Bartonella Referring Physician: Cena Benton  Impression/Recommendation Weakness ? Bartonella infection Would - treat him symptomatically at this point, no anbx Would a LP be of use? skni bx? Have neuro see him, contact Dr Sandria Manly (he is faxing me his records)  Comment-  A brief search of B vinsonii relates this mostly to dog infections. I will contact a colleague at [facility] who is an expert on tick borne infections for help with this. I am not sure his current syndrome relates to this as he appears to have gotten a significant amount of (appropriate) therapy for Bartonella.   Will follow with you, Thank you so much for this interesting consult,   Raymond Valdez [phone]  Raymond Valdez is an 78 y.o. male.  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 He went to a lecture and felt that the syndrome that was being described matched his. 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 He was treated intermittently with doxy (1 month), rifampin (1 month), azithro (1 month) and another anbx (1 month as well). He did not improve.  Over the last several months he has developed a non-pruritic, non-burning, non raised rash.  He has developed a neuropathy in his LE as well (decreased strength and sensation). He was treated with ealvil, neurontin, lyrica (sequentially) without improvement. He was evaluated by neurology (Dr Ileene Rubens). He had a MRI showing cerebellar atrophy, and he has also gotten epidural steroid as well (without relief). Had EMG consistent with radiculopathy and peripheral neuropathy.  1 month ago he traveled to [place] and had a cold afterwards (was treated with a month of doxy prior). He was given anbx for his cold (can't remember name).  Denies tick/insect bites at that time.  Over last 48h he has had increasing weakness. He has needed assistance from cane/walker and family  members.  ROS- no f/c, normal bowel movements, normal urination.   Past Medical History  Diagnosis Date  . Macular degeneration   . History of prostate cancer   . Infection, bartonella   . Spinal stenosis     Past Surgical History  Procedure Date  . Appendectomy   . Tonsillectomy   . Prostate surgery 1998    surgery and then radiation 7 years later in 2005  ergies:   Allergies  Allergen Reactions  . Sulfa Antibiotics Itching and Rash  . Sulfa Drugs Cross Reactors Itching and Rash    Tribrisen, a sulfa potentiator, also causes reaction in patient, as does sulfa    Medications: Scheduled:    Social History:  reports that he quit smoking about 41 years ago. He has never used smokeless tobacco. He reports that he does not drink alcohol or use illicit drugs.  History reviewed. No pertinent family history.  General ROS: see above  Blood pressure 126/70, pulse 76, temperature 100 F (37.8 C), temperature source Oral, resp. rate 16, height 5\' 8"  (1.727 m), weight 80.74 kg (178 lb), SpO2 97.00%. General appearance: alert, cooperative, fatigued and no distress Eyes: negative findings: conjunctivae and sclerae normal and pupils equal, round, reactive to light and accomodation Throat: lips, mucosa, and tongue normal; teeth and gums normal Neck: no adenopathy and thyroid not enlarged, symmetric, no tenderness/mass/nodules Lungs: clear to auscultation bilaterally Heart: regular rate and rhythm Extremities: edema >3+ in LE Neurologic: Cranial nerves: normal Sensory: grossly normal in LE Motor:  normal UE grip and LE plantar. he has remarkable UE intention tremor   Results for orders placed during the hospital encounter of 06/10/12 (from the past 48 hour(s))  CBC WITH DIFFERENTIAL     Status: Abnormal   Collection Time   06/10/12 11:45 AM      Component Value Range Comment   WBC 13.4 (*) 4.0 - 10.5 K/uL    RBC 4.48  4.22 - 5.81 MIL/uL    Hemoglobin 14.1  13.0 - 17.0 g/dL    HCT  16.1  09.6 - 04.5 %    MCV 91.3  78.0 - 100.0 fL    MCH 31.5  26.0 - 34.0 pg    MCHC 34.5  30.0 - 36.0 g/dL    RDW 40.9  81.1 - 91.4 %    Platelets 140 (*) 150 - 400 K/uL    Neutrophils Relative 79 (*) 43 - 77 %    Neutro Abs 10.6 (*) 1.7 - 7.7 K/uL    Lymphocytes Relative 11 (*) 12 - 46 %    Lymphs Abs 1.5  0.7 - 4.0 K/uL    Monocytes Relative 9  3 - 12 %    Monocytes Absolute 1.3 (*) 0.1 - 1.0 K/uL    Eosinophils Relative 0  0 - 5 %    Eosinophils Absolute 0.0  0.0 - 0.7 K/uL    Basophils Relative 0  0 - 1 %    Basophils Absolute 0.0  0.0 - 0.1 K/uL   APTT     Status: Normal   Collection Time   06/10/12 11:45 AM      Component Value Range Comment   aPTT 36  24 - 37 seconds   PROTIME-INR     Status: Abnormal   Collection Time   06/10/12 11:45 AM      Component Value Range Comment   Prothrombin Time 15.7 (*) 11.6 - 15.2 seconds    INR 1.22  0.00 - 1.49   COMPREHENSIVE METABOLIC PANEL     Status: Abnormal   Collection Time   06/10/12 11:45 AM      Component Value Range Comment   Sodium 139  135 - 145 mEq/L    Potassium 4.3  3.5 - 5.1 mEq/L    Chloride 103  96 - 112 mEq/L    CO2 27  19 - 32 mEq/L    Glucose, Bld 101 (*) 70 - 99 mg/dL    BUN 25 (*) 6 - 23 mg/dL    Creatinine, Ser 7.82  0.50 - 1.35 mg/dL    Calcium 9.0  8.4 - 95.6 mg/dL    Total Protein 6.7  6.0 - 8.3 g/dL    Albumin 2.8 (*) 3.5 - 5.2 g/dL    AST 70 (*) 0 - 37 U/L ICTERIC SPECIMEN   ALT 29  0 - 53 U/L    Alkaline Phosphatase 42  39 - 117 U/L    Total Bilirubin 1.3 (*) 0.3 - 1.2 mg/dL    GFR calc non Af Amer 85 (*) >90 mL/min    GFR calc Af Amer >90  >90 mL/min   LACTIC ACID, PLASMA     Status: Normal   Collection Time   06/10/12 12:15 PM      Component Value Range Comment   Lactic Acid, Venous 2.1  0.5 - 2.2 mmol/L   URINALYSIS, ROUTINE W REFLEX MICROSCOPIC     Status: Abnormal   Collection Time   06/10/12 12:38 PM  Component Value Range Comment   Color, Urine AMBER (*) YELLOW BIOCHEMICALS MAY BE  AFFECTED BY COLOR   APPearance CLOUDY (*) CLEAR    Specific Gravity, Urine 1.029  1.005 - 1.030    pH 5.5  5.0 - 8.0    Glucose, UA NEGATIVE  NEGATIVE mg/dL    Hgb urine dipstick NEGATIVE  NEGATIVE    Bilirubin Urine NEGATIVE  NEGATIVE    Ketones, ur TRACE (*) NEGATIVE mg/dL    Protein, ur NEGATIVE  NEGATIVE mg/dL    Urobilinogen, UA 1.0  0.0 - 1.0 mg/dL    Nitrite NEGATIVE  NEGATIVE    Leukocytes, UA NEGATIVE  NEGATIVE MICROSCOPIC NOT DONE ON URINES WITH NEGATIVE PROTEIN, BLOOD, LEUKOCYTES, NITRITE, OR GLUCOSE <1000 mg/dL.   No results found for this basename: sdes, specrequest, cult, reptstatus   Dg Chest 2 View  06/10/2012  *RADIOLOGY REPORT*  Clinical Data: Chest pain.  CHEST - 2 VIEW  Comparison: None.  Findings: Calcified lymph nodes more notable right hilar region suggestive of prior granulomas exposure.  Left base subsegmental atelectasis/scarring suspected.  No infiltrate, congestive heart failure or pneumothorax.  Calcified tortuous aorta.  Heart size within normal limits.  Degenerative changes thoracic spine with mild loss of height lower thoracic vertebra.  This is incompletely assessed on the present exam.  IMPRESSION: Calcified lymph nodes more notable right hilar region suggestive of prior granulomas exposure.  Left base subsegmental atelectasis/scarring suspected.  No infiltrate, congestive heart failure or pneumothorax.  Calcified tortuous aorta.  Original Report Authenticated By: Fuller Canada, M.D.   No results found for this or any previous visit (from the past 240 hour(s)).    06/10/2012, 4:34 PM     LOS: 0 days

## 2012-06-10 NOTE — ED Notes (Signed)
Patience requesting a meal RN notified

## 2012-06-10 NOTE — ED Provider Notes (Signed)
History     CSN: [id number]  Arrival date & time 06/10/12  1010   First MD Initiated Contact with Patient 06/10/12 1017      Chief Complaint  Patient presents with  . Fatigue  . Rash    (Consider location/radiation/quality/duration/timing/severity/associated sxs/prior treatment) HPI Comments: Patient presents with generalized weakness and joint pains. He has a complicated history and was treated for a version of lyme disease 2 years ago. 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 He [facility]. He currently works as a International aid/development worker. At that time 2 years ago he presented with a rash as well as fatigue and joint swelling. In March of this year he was noticing a recurrence of his symptoms since on infectious disease specialists here at [facility]. He noted that 2 months ago symptoms started progressing with increased joint swelling and fatigue. His Lyme titers were drawn and are currently awaiting final testing through the [facility]. He did complete a one-month cycle of doxycycline which she finished in May. He presents today with markedly worse joint swelling and joint pains as well as increased fatigue. Over the weekend he started to the point where he can't set up on his own and cannot ambulate. He denies any headache or neck pain or stiffness denies a fevers or chills. He has some chronic ongoing low back pain but denies any current back pain. He does have the rash which was consistent with his past Lyme rash which gone on for about the last year off and on  The history is provided by the patient.    Past Medical History  Diagnosis Date  . Macular degeneration   . History of prostate cancer   . Infection, bartonella   . Spinal stenosis     Past Surgical History  Procedure Date  . Appendectomy   . Tonsillectomy   . Prostate surgery 1998    surgery and then radiation 7 years later in 2005    History reviewed.  No pertinent family history.  History  Substance Use Topics  . Smoking status: Former Smoker    Quit date: 11/27/1970  . Smokeless tobacco: Never Used  . Alcohol Use: No      Review of Systems  Constitutional: Positive for fatigue. Negative for fever, chills and diaphoresis.  HENT: Negative for congestion, rhinorrhea and sneezing.   Eyes: Negative.  Negative for photophobia and visual disturbance.  Respiratory: Negative for cough, chest tightness and shortness of breath.   Cardiovascular: Negative for chest pain and leg swelling.  Gastrointestinal: Negative for nausea, vomiting, abdominal pain, diarrhea and blood in stool.  Genitourinary: Negative for frequency, hematuria, flank pain and difficulty urinating.  Musculoskeletal: Positive for joint swelling, arthralgias and gait problem. Negative for back pain.  Skin: Positive for rash.  Neurological: Positive for tremors (chronic and at basline) and numbness (has chronic neuropathy to left leg since original lyme dx). Negative for dizziness, speech difficulty, weakness and headaches.    Allergies  Sulfa antibiotics and Sulfa drugs cross reactors  Home Medications   No current outpatient prescriptions on file.  BP 144/82  Pulse 76  Temp 98.8 F (37.1 C) (Oral)  Resp 16  Ht 5\' 8"  (1.727 m)  Wt 178 lb (80.74 kg)  BMI 27.06 kg/m2  SpO2 94%  Physical Exam  Constitutional: He is oriented to person, place, and time. He appears well-developed and well-nourished.  HENT:  Head: Normocephalic and atraumatic.  Eyes: Pupils  are equal, round, and reactive to light.  Neck: Normal range of motion. Neck supple.  Cardiovascular: Normal rate, regular rhythm and normal heart sounds.   Pulmonary/Chest: Effort normal and breath sounds normal. No respiratory distress. He has no wheezes. He has no rales. He exhibits no tenderness.  Abdominal: Soft. Bowel sounds are normal. There is no tenderness. There is no rebound and no guarding.    Musculoskeletal: Normal range of motion. He exhibits edema and tenderness.  Lymphadenopathy:    He has no cervical adenopathy.  Neurological: He is alert and oriented to person, place, and time. No cranial nerve deficit.       Pt is oriented, but has difficulty focusing on conversation and has tangential thinking, has generalized weakness.  No focal deficits noted.  No facial drooping or slurred speech  Skin: Skin is warm and dry. No rash noted.  Psychiatric: He has a normal mood and affect.    ED Course  Procedures (including critical care time)  Labs Reviewed  CBC WITH DIFFERENTIAL - Abnormal; Notable for the following:    WBC 13.4 (*)     Platelets 140 (*)     Neutrophils Relative 79 (*)     Neutro Abs 10.6 (*)     Lymphocytes Relative 11 (*)     Monocytes Absolute 1.3 (*)     All other components within normal limits  PROTIME-INR - Abnormal; Notable for the following:    Prothrombin Time 15.7 (*)     All other components within normal limits  COMPREHENSIVE METABOLIC PANEL - Abnormal; Notable for the following:    Glucose, Bld 101 (*)     BUN 25 (*)     Albumin 2.8 (*)     AST 70 (*)  ICTERIC SPECIMEN   Total Bilirubin 1.3 (*)     GFR calc non Af Amer 85 (*)     All other components within normal limits  URINALYSIS, ROUTINE W REFLEX MICROSCOPIC - Abnormal; Notable for the following:    Color, Urine AMBER (*)  BIOCHEMICALS MAY BE AFFECTED BY COLOR   APPearance CLOUDY (*)     Ketones, ur TRACE (*)     All other components within normal limits  CBC - Abnormal; Notable for the following:    WBC 11.3 (*)     RBC 3.97 (*)     Hemoglobin 11.9 (*)     HCT 36.5 (*)     Platelets 128 (*)     All other components within normal limits  BASIC METABOLIC PANEL - Abnormal; Notable for the following:    Glucose, Bld 109 (*)     GFR calc non Af Amer 81 (*)     All other components within normal limits  SEDIMENTATION RATE - Abnormal; Notable for the following:    Sed Rate 66 (*)      All other components within normal limits  APTT  LACTIC ACID, PLASMA  VITAMIN B12  ANA  CK  VITAMIN D 25 HYDROXY  MISCELLANEOUS TEST  VITAMIN B1  FOLATE RBC  VITAMIN D 1,25 DIHYDROXY  COMPREHENSIVE METABOLIC PANEL  RPR  TSH   Mr Cervical Spine Wo Contrast  06/13/2012  *RADIOLOGY REPORT*  Clinical Data: Bilateral lower extremity weakness.  Recent bacterial infection.  History prostate cancer.  MRI CERVICAL SPINE WITHOUT CONTRAST  Technique:  Multiplanar and multiecho pulse sequences of the cervical spine, to include the craniocervical junction and cervicothoracic junction, were obtained according to standard protocol without intravenous contrast.  Comparison: 07/08/2009 MR brain.  No comparison cervical spine MR.  Findings: Cervical medullary junction unremarkable.  No focal cervical cord signal abnormality.  Fluid noted within the C1 lateral mass - C2 articulation.  Edema within the adjacent posterior soft tissues/paraspinal muscles are more notable on the left.  Given the patient's history of recent bacterial infection, infection of the joint space and surrounding soft tissue not excluded.  This can be re-evaluated with close follow-up if there are progressive symptoms.  The bony structures appear intact.  No other findings to suggest diskitis/osteomyelitis.  C2-3:  Minimal bulge greater to the left.  Minimal left foraminal narrowing. Right facet joint may be fused.  C3-4: Small spur greater to the left.  Mild left-sided cord contact.  Mild left foraminal narrowing.  C4-5:  Broad-based disc osteophyte complex greater to the left. Minimal left-sided cord flattening.  Uncinate spurring.  Mild to slightly moderate bilateral foraminal narrowing.  C5-6:  Small broad-based disc osteophyte greater to the left. Minimal left-sided cord flattening.  Uncinate spurring.  Moderate bilateral foraminal narrowing.  C6-7:  Small broad-based disc osteophyte greater to the right.  No significant cord flattening.   Uncinate spurring.  Moderate bilateral foraminal narrowing.  C7-T1:  Minimal bulge.  IMPRESSION:  Fluid noted within the C1 lateral mass - C2 articulation.  Edema within the adjacent posterior soft tissues/paraspinal muscles are more notable on the left.  Given the patient's history of recent bacterial infection, infection of the joint space and surrounding soft tissue not excluded.  Mild to slightly moderate degenerative changes most notable C4-5 through C6-7 as detailed above.  This has been made a PRA call report utilizing dashboard call feature.  Original Report Authenticated By: Fuller Canada, M.D.     1. Weakness generalized   2. Infection, bartonella   3. Lower extremity weakness   4. Spinal stenosis   5. Edema   6. History of prostate cancer       MDM  Will check labs chest x-ray, patient will need admission and infectious disease consult. He does have some difficulty with thought process but no other symptoms to suggest meningitis or encephalitis.        Rolan Bucco, MD 06/13/12 1131

## 2012-06-10 NOTE — ED Notes (Signed)
Pt states he had Bartonella visconni berkoffia (Lyme disease) 2 years ago.  Pt was treated and tested negative afterwords.  Per pt, this form of lyme disease does not show up on regular lyme disease test.  Pt had test drawn back in May, but results still pending.  Pt states he has been having generalized weakness and fatigue to the point he is unable to get up out of bed.  Normally able to get up and walk normally.  Pt also has generalized red rash on chest with no itching, that pt states he had last time he had lyme disease.  Also arthritis pain in knees worse, which he says also happened 2 years ago.  Denies N/V/D or sob.  Pt alert and oriented

## 2012-06-10 NOTE — Consult Note (Signed)
[facility] CONSULT NOTE     Reason for Consult: Progressive weakness, primarily lower extremities.    HPI:    Raymond Valdez is an 78 y.o. male with a history of prostate cancer, lumbar spinal stenosis and chronic infection with Bartonella vinsonii and about 2 years. He's been undergoing intermittent antibiotic treatment with doxycycline over the past couple of years. The last treatment was approximately 4-6 weeks ago. He's been experiencing progressive weakness primarily involving his lower extremities for about 2 years. Weakness appears to have accelerated over the past 2 weeks. He has become progressively weak to the point that he is unable to stand without assistance and unable to ambulate at this point. She has chronic urinary incontinence which was postop sequela of prostate cancer. He said no problems with bowel control. He has chronic peripheral neuropathy and has been a patient of Dr. Fayrene Fearing love with [facility]. Etiology for peripheral neuropathy is not clear. As the Propp as experienced progressive weakness of his hands over the past year. He's been involved with physical therapy including strengthening of the neck muscles in addition to extremity muscles. Vitamin B12 level is pending. ANA is also pending. Lumbosacral MRI was performed today. Results pending.  Past Medical History  Diagnosis Date  . Macular degeneration   . History of prostate cancer   . Infection, bartonella   . Spinal stenosis     Past Surgical History  Procedure Date  . Appendectomy   . Tonsillectomy   . Prostate surgery 1998    surgery and then radiation 7 years later in 2005    History reviewed. No pertinent family history.  Social History:  reports that he quit smoking about 41 years ago. He has never used smokeless tobacco. He reports that he does not drink alcohol or use illicit drugs.  Allergies  Allergen Reactions  . Sulfa Antibiotics  Itching and Rash  . Sulfa Drugs Cross Reactors Itching and Rash    Tribrisen, a sulfa potentiator, also causes reaction in patient, as does sulfa    Medications:    Scheduled:   . beta carotene w/minerals  1 tablet Oral Daily  . sodium chloride  3 mL Intravenous Q12H   ZOX:WRUEAV chloride, docusate sodium, HYDROcodone-acetaminophen, iohexol, ondansetron (ZOFRAN) IV, ondansetron   Blood pressure 126/70, pulse 76, temperature 100 F (37.8 C), temperature source Oral, resp. rate 16, height 5\' 8"  (1.727 m), weight 80.74 kg (178 lb), SpO2 97.00%.   Neurologic Examination:  Mental Status: Alert, oriented, thought content appropriate.  Speech fluent without evidence of aphasia. Able to follow commands without difficulty. Cranial Nerves: II-Visual fields were normal. III/IV/VI-Pupils were equal and reacted. Extraocular movements were full and conjugate.    V/VII-no facial numbness and no facial weakness. VIII-normal. X-normal speech and symmetrical palatal movement. XII-midline tongue extension Motor: 4/5 neck flexors strength; 5/5 neck extensors strength; 5/5 right and left biceps strength; 4/5 triceps and rotators of the arms bilaterally; 4+ over 5 wrist extensors; 4/5 right and left first DI and ADQ muscles; 3/5 right hip flexors and 2/5 left hip flexors; 3/5 right and left quadriceps and hamstrings; 5-/5 tibialis anterior muscles bilaterally; 5/5 plantar flexors of the feet. Muscle tone was flaccid throughout. No fasciculations are noted. Sensory: Markedly reduced vibratory sensation distally in both lower extremities, as well as reduced tactile sensation distally. Deep Tendon Reflexes: 2+ and brisk in  upper extremities; 1+ and symmetric at knees and ankles.  Plantars: Mute bilaterally Cerebellar: Moderately impaired with bilateral intention tremor of upper extremities. Carotid auscultation: Normal    Assessment/Plan:  1. Progressive diffuse muscle weakness with primarily  involvement of lower extremities. Etiology is unclear at this point. 2. Peripheral neuropathy and lumbar spinal stenosis exacerbating #1.  3. Bertonella vinsonii infection - unclear significance. 4. GBS is unlikely, given the extent of patient's muscle weakness and lower extremity deep tendon reflexes remaining intact, as well as chronicity of patient's progressive weakness.  Recommendations: 1. Sedimentation rate 2. Total CK level 3. Agree with obtaining vitamin B12 level 4. Folate level 5. Vitamin D level 6. No indications for lumbar puncture 7. Cervical spine MRI study without contrast 8. Continue physical therapy intervention  C.R. Roseanne Reno, MD [facility] [phone]  06/10/2012, 9:07 PM

## 2012-06-10 NOTE — H&P (Addendum)
[facility] History and Physical  Raymond Valdez ZOX:[id number] DOB: 02-18-1929 DOA: 06/10/2012  Referring physician: Dr. Fredderick Phenix (ED MD) PCP: Redmond Baseman, MD   Chief Complaint: Progressive BL lower extremity weakness  HPI:  78 y/o CM with h/o prostate cancer s/p prostate surgery (which has left patient incontinent), with history of spinal stenosis, and prior infection with Bartonella vinsonii ~ 2 years ago of which he has had treatment with antibiotics on and off with doxycycline reportedly.  His most recent cycle of doxycycline was finished in May.  He reports that his decrease in strength BL at lower extremities has been progressively worse especially within the last 2 weeks and he is at the point where he can not ambulate and has required help from his grandson to get in and out of bed.  Two weeks ago patient reportedly had an upper respiratory infection which he reports he was prescribed antibiotics for.    On history patient mentions that he had Lyme titers drawn and he is awaiting results and this was [facility].  He denies any recent bouts of diarrhea.  While in the ED patient was evaluated and chest x ray was obtained which was negative for infiltrate, congestive heart failure, or pneumothorax.  Initial lab work up showed a elevated wbc of 13.4, platelets of 140, lactic acid level of 2.1 (normal),.  We were asked to admit patient for further evaluation and recommendations given his worsening BL extremity weakness  Review of Systems:  Patient endorses tremor (new onset ~ 2 months), difficulty "obtaining right words," BL lower extremity weakness, urinary incontinence (which has been present since after his prostate surgery) and rash.  Denies any diarrhea, nausea, abdominal discomfort, fevers, chills, HA's, BRBPR, paresthesias.  Past Medical History  Diagnosis Date  . Macular degeneration   . History of prostate cancer   . Infection,  bartonella   . Spinal stenosis    Past Surgical History  Procedure Date  . Appendectomy   . Tonsillectomy   . Prostate surgery 1998    surgery and then radiation 7 years later in 2005   Social History:  reports that he quit smoking about 41 years ago. He has never used smokeless tobacco. He reports that he does not drink alcohol or use illicit drugs.  where does patient live--home with wife Can't participate in ADLs due to weakness  Allergies  Allergen Reactions  . Sulfa Antibiotics Itching and Rash  . Sulfa Drugs Cross Reactors Itching and Rash    Tribrisen, a sulfa potentiator, also causes reaction in patient, as does sulfa    History reviewed. No pertinent family history. Denies any family history related to vasculitis.  Prior to Admission medications   Medication Sig Start Date End Date Taking? Authorizing Provider  beta carotene w/minerals (OCUVITE) tablet Take 1 tablet by mouth daily.   Yes Historical Provider, MD  furosemide (LASIX) 20 MG tablet Take 40 mg by mouth Daily.  04/06/12  Yes Historical Provider, MD  KLOR-CON M20 20 MEQ tablet Take 1 tablet by mouth as needed. 03/14/12  Yes Historical Provider, MD  Omega-3 Fatty Acids (FISH OIL PO) Take 1 tablet by mouth daily.   Yes Historical Provider, MD   Physical Exam: Filed Vitals:   06/10/12 1025 06/10/12 1046 06/10/12 1303  BP: 116/63 116/63 127/53  Pulse: 90  86  Temp: 98.5 F (36.9 C) 98.5 F (36.9 C) 97.8 F (36.6 C)  TempSrc: Oral Oral Oral  Resp: 16 16 18  SpO2: 100% 100%      General:  Patient is alert and oriented x 3  Eyes: PERRLA, EOMI  ENT: No visible masses on examination  Neck: Supple  Cardiovascular: RRR with periodic ectopic beats, No murmurs  Respiratory: Clear to auscultation, no wheezes  Abdomen: Soft, NT, ND  Skin: Macular rash predominantly on trunk  Musculoskeletal: No pain on palpation over lower extremities  Psychiatric: Mood and affect appropriate  Neurologic: Has tremor,  suspicious for mask like facies, Decrease strength BL at lower extremities.  Reflexes seemed diminished at lower extremities BL when compared to Upper extremities although patient was a difficult exam.  Equal strength with flexion of toes BL  Labs on Admission:  Basic Metabolic Panel:  Lab 06/10/12 8413  NA 139  K 4.3  CL 103  CO2 27  GLUCOSE 101*  BUN 25*  CREATININE 0.73  CALCIUM 9.0  MG --  PHOS --   Liver Function Tests:  Lab 06/10/12 1145  AST 70*  ALT 29  ALKPHOS 42  BILITOT 1.3*  PROT 6.7  ALBUMIN 2.8*   No results found for this basename: LIPASE:5,AMYLASE:5 in the last 168 hours No results found for this basename: AMMONIA:5 in the last 168 hours CBC:  Lab 06/10/12 1145  WBC 13.4*  NEUTROABS 10.6*  HGB 14.1  HCT 40.9  MCV 91.3  PLT 140*   Cardiac Enzymes: No results found for this basename: CKTOTAL:5,CKMB:5,CKMBINDEX:5,TROPONINI:5 in the last 168 hours  BNP (last 3 results) No results found for this basename: PROBNP:3 in the last 8760 hours CBG: No results found for this basename: GLUCAP:5 in the last 168 hours  Radiological Exams on Admission: Dg Chest 2 View  06/10/2012  *RADIOLOGY REPORT*  Clinical Data: Chest pain.  CHEST - 2 VIEW  Comparison: None.  Findings: Calcified lymph nodes more notable right hilar region suggestive of prior granulomas exposure.  Left base subsegmental atelectasis/scarring suspected.  No infiltrate, congestive heart failure or pneumothorax.  Calcified tortuous aorta.  Heart size within normal limits.  Degenerative changes thoracic spine with mild loss of height lower thoracic vertebra.  This is incompletely assessed on the present exam.  IMPRESSION: Calcified lymph nodes more notable right hilar region suggestive of prior granulomas exposure.  Left base subsegmental atelectasis/scarring suspected.  No infiltrate, congestive heart failure or pneumothorax.  Calcified tortuous aorta.  Original Report Authenticated By: Fuller Canada,  M.D.    EKG: Independently reviewed. Sinus rhythm with PVC's no ST elevation or depression.  Assessment/Plan Active Problems: Lower extremity weakness H/o spinal stenosis H/o infection with Bartonella vinsonii    1. Lower extremity weakness - In the setting of history of bartonella vinsonii infection, h/o spinal stenosis, and Recent upper respiratory infection. - Given recent history of recent upper respiratory infection with BL lower extremities involvement we should also consider Guillain-Barre syndrome and as such would consider obtaining CSF for further evaluation with typical findings being elevated CSF protein but CSF cell count typically normal < 5 cells/mm3.  (Will defer decision to collect CSF to ID/Neurology as there may be other CSF components which they may want as part of their differential)   I will go ahead and order nerve conduction studies, Antibodies against GQ1b if available, and await neurology evaluation - Consult Neurology - Will also order other lab work to analyze for other possible diagnosis like (severe vitamin B1 deficiency, vitamin B12, ANA (checking for vasculitis)) will not check serologic test for campylobacter since patient has not had any reports of  diarrhea - Patient does have history of spinal stenosis and given that he has incontinence after prostate surgery would consider obtaining lumbar MRI.  - Physical therapy to eval and treat  2. H/o Bartonella vinsonii infection - Included in the differential would be lyme disease and agree with ID consult.  As such am very interested in their input currently.  Patient with rash and neurological symptoms which may be related and contributing to Number 1  For other plans please refer to orders   Code Status: full Family Communication: Spoke with wife, patient, and grandson at bedside. Disposition Plan: Pending clinical improvement in condition  Penny Pia [facility] Pager [phone]  If 7PM-7AM,  please contact night-coverage [email or web address] Password [email or web address] 06/10/2012, 3:19 PM   Addendum > 70 minutes medical decision making, physical exam, obtaining history, documenting, placing orders.

## 2012-06-11 LAB — BASIC METABOLIC PANEL
Chloride: 103 mEq/L (ref 96–112)
Creatinine, Ser: 0.82 mg/dL (ref 0.50–1.35)
GFR calc Af Amer: >90 mL/min (ref >90)
GFR calc non Af Amer: 81 mL/min — ABNORMAL LOW (ref >90)
Potassium: 3.6 mEq/L (ref 3.5–5.1)

## 2012-06-11 LAB — CBC
MCHC: 32.6 g/dL (ref 30.0–36.0)
Platelets: 128 10*3/uL — ABNORMAL LOW (ref 150–400)
RDW: 14.4 % (ref 11.5–15.5)
WBC: 11.3 10*3/uL — ABNORMAL HIGH (ref 4.0–10.5)

## 2012-06-11 LAB — ANA: Anti Nuclear Antibody(ANA): NEGATIVE

## 2012-06-11 MED ORDER — ACETAMINOPHEN 325 MG PO TABS: 650.0000 mg | ORAL_TABLET | Freq: Four times a day (QID) | ORAL | Status: DC | PRN

## 2012-06-11 MED ORDER — MELOXICAM 7.5 MG PO TABS
7.5000 mg | ORAL_TABLET | Freq: Every day | ORAL | Status: DC
  Administered 2012-06-11 – 2012-06-14 (×4): 7.5 mg via ORAL
  Filled 2012-06-11 (×4): qty 1

## 2012-06-11 MED ORDER — POLYVINYL ALCOHOL 1.4 % OP SOLN
1.0000 [drp] | OPHTHALMIC | Status: DC | PRN
  Administered 2012-06-11 (×2): 1 [drp] via OPHTHALMIC
  Filled 2012-06-11: qty 15

## 2012-06-11 NOTE — Progress Notes (Signed)
INFECTIOUS DISEASE PROGRESS NOTE  ID: Raymond Valdez is a 78 y.o. male with   Principal Problem:  *Lower extremity weakness Active Problems:  History of prostate cancer  Infection, bartonella  Spinal stenosis  Subjective: Feels slightly better.   Abtx:  Anti-infectives    None      Medications:  Scheduled:   . beta carotene w/minerals  1 tablet Oral Daily  . meloxicam  7.5 mg Oral Daily  . sodium chloride  3 mL Intravenous Q12H    Objective: Vital signs in last 24 hours: Temp:  [99.3 F (37.4 C)-100 F (37.8 C)] 99.3 F (37.4 C) (07/16 0623) Pulse Rate:  [76-82] 82  (07/16 0623) Resp:  [16-18] 18  (07/16 0623) BP: (112-126)/(56-74) 124/74 mmHg (07/16 0623) SpO2:  [95 %-97 %] 95 % (07/16 0623) Weight:  [80.74 kg (178 lb)] 80.74 kg (178 lb) (07/15 1546)   General appearance: alert, cooperative, fatigued and no distress Resp: clear to auscultation bilaterally Cardio: regular rate and rhythm GI: normal findings: bowel sounds normal and soft, non-tender  Lab Results  Basename 06/11/12 0330 06/10/12 1145  WBC 11.3* 13.4*  HGB 11.9* 14.1  HCT 36.5* 40.9  NA 138 139  K 3.6 4.3  CL 103 103  CO2 27 27  BUN 22 25*  CREATININE 0.82 0.73  GLU -- --   Liver Panel  Basename 06/10/12 1145  PROT 6.7  ALBUMIN 2.8*  AST 70*  ALT 29  ALKPHOS 42  BILITOT 1.3*  BILIDIR --  IBILI --   Sedimentation Rate No results found for this basename: ESRSEDRATE in the last 72 hours C-Reactive Protein No results found for this basename: CRP:2 in the last 72 hours  Microbiology: No results found for this or any previous visit (from the past 240 hour(s)).  Studies/Results: Dg Chest 2 View  06/10/2012  *RADIOLOGY REPORT*  Clinical Data: Chest pain.  CHEST - 2 VIEW  Comparison: None.  Findings: Calcified lymph nodes more notable right hilar region suggestive of prior granulomas exposure.  Left base subsegmental atelectasis/scarring suspected.  No infiltrate, congestive heart  failure or pneumothorax.  Calcified tortuous aorta.  Heart size within normal limits.  Degenerative changes thoracic spine with mild loss of height lower thoracic vertebra.  This is incompletely assessed on the present exam.  IMPRESSION: Calcified lymph nodes more notable right hilar region suggestive of prior granulomas exposure.  Left base subsegmental atelectasis/scarring suspected.  No infiltrate, congestive heart failure or pneumothorax.  Calcified tortuous aorta.  Original Report Authenticated By: Fuller Canada, M.D.   Ct Head W Wo Contrast  06/10/2012  *RADIOLOGY REPORT*  Clinical Data: Headache.  Stiff neck.  Bilateral lower extremity weakness.  CT HEAD WITHOUT AND WITH CONTRAST  Technique:  Contiguous axial images were obtained from the base of the skull through the vertex without and with intravenous contrast.  Contrast: OMNIPAQUE IOHEXOL 300 MG/ML  SOLN  Comparison: MRI of the brain dated 07/08/2009  Findings: There is no acute intracranial hemorrhage, infarction, or mass lesion.  There is diffuse cerebral cortical and cerebellar atrophy. Brain parenchyma is otherwise normal.  No osseous abnormality.  No ventricular dilatation.  IMPRESSION: No acute intracranial abnormality.  Diffuse atrophy.  No pathologic enhancement after contrast administration.  Original Report Authenticated By: Gwynn Burly, M.D.   Mr Lumbar Spine Wo Contrast  06/11/2012  *RADIOLOGY REPORT*  Clinical Data: Progressive bilateral leg weakness.  MRI LUMBAR SPINE WITHOUT CONTRAST  Technique:  Multiplanar and multiecho pulse sequences of  the lumbar spine were obtained without intravenous contrast.  Comparison: Lumbar MRI 07/23/2010  Findings: Normal lumbar alignment.  Negative for fracture. Moderately large hemangioma in the L1 vertebral body on the right is unchanged.  Heterogeneous bone marrow signal is present but without focal mass lesion.  Conus medullaris is normal and terminates at L1.  T12-L1:  Negative  L1-2:   Mild disc degeneration  L2-3:  Disc degeneration and disc space narrowing with early vertebral spurring.  Mild facet degeneration with mild narrowing of the spinal canal.  L3-4:  Disc degeneration and spondylosis.  Right-sided disc bulging is present.  Mild facet degeneration.  Mild spinal stenosis.  Mild right foraminal stenosis.  L4-5:  Disc bulging and spurring.  Bilateral facet hypertrophy with mild spinal stenosis.  L5-S1:  Mild disc degeneration.  Bilateral facet degeneration with facet joint hypertrophy causing mild foraminal stenosis on the right and moderate foraminal stenosis on the left.  IMPRESSION: Negative for fracture.  Negative for critical spinal stenosis.  Lumbar degenerative changes are present as described above.  There is right foraminal narrowing at L3-4.  Mild spinal stenosis at L4- 5.  Foraminal stenosis at L5-S1, left greater than right.  Original Report Authenticated By: Camelia Phenes, M.D.     Assessment/Plan: Weakness B vinsonii Spoke with colleague at [facility]. This has been seen in veterinarians, requires special media to isolate from blood.  Will ask pt to provide his records of his serologies as well as contact for [facility] lab [employer] [phone] ). He currently has repeat labs pending there.   Will start pt on doxy/rifampin after his current testing/serologies are available.- pt does not want to start at this time as it makes him too tired.  Ask him to f/u with [facility] at d/c.   [name] Infectious Diseases [phone] 06/11/2012, 1:31 PM   LOS: 1 day

## 2012-06-11 NOTE — Progress Notes (Signed)
[facility] PROGRESS NOTE  Raymond Valdez ZOX:[id number] DOB: 12/12/1928 DOA: 06/10/2012 PCP: Redmond Baseman, MD  Assessment/Plan: Principal Problem:  *Lower extremity weakness Active Problems:  History of prostate cancer  Infection, bartonella  Spinal stenosis  1. BL lower extremity weakness - At this point etiology is uncertain.  May be related to infectious etiology given history of infection with B vinsonii. - ID on board and we will follow up with their recommendations as they are obtaining more information from prior serologies recently obtained at [facility] lab - Will have to follow up on Neurology's recommendations as well as other neurological etiology's are possible especially given history that this happened after a URI and has progressed significantly to the point that patient is unable to ambulate (which reportedly he was able to do > 2 weeks ago).   - f/u on nerve conduction studies - CT of head unremarkable - MRI of lumbar spine with no critical spinal stenosis reportedly - Vitamin B 12 within normal limits - Vitamin B 1 pending - antibodies to GQ1b (a ganglioside component of nerve) Pending.   Code Status: full Family Communication: Spoke with wife at bedside Disposition Plan: Pending clinical improvement and further recommendations from specialist involved.   Brief narrative: Please refer to HPI  Consultants:  Neurology  ID  Procedures:  MRI of lumbar spine  Antibiotics:  none  HPI/Subjective: Patient has no acute complaints currently.  No improvement in weakness.  Denies any fever, chills, nausea, emesis.  Seems somewhat confused today and could not answer when was the last time he had systemic corticosteroid or what dose.  Kept referring back to the last time he administered it to his patients.  Objective: Filed Vitals:   06/10/12 1546 06/10/12 2132 06/11/12 0623 06/11/12 1345  BP: 126/70 112/56 124/74 132/67  Pulse: 76 81 82 82  Temp:  100 F (37.8 C) 99.5 F (37.5 C) 99.3 F (37.4 C) 98.7 F (37.1 C)  TempSrc: Oral Oral Oral   Resp: 16 18 18 16   Height: 5\' 8"  (1.727 m)     Weight: 80.74 kg (178 lb)     SpO2: 97% 97% 95% 98%    Intake/Output Summary (Last 24 hours) at 06/11/12 1614 Last data filed at 06/11/12 1300  Gross per 24 hour  Intake    960 ml  Output    575 ml  Net    385 ml    Exam:   General:  Pt in NAD,   Cardiovascular: RRR, No MRG  Respiratory: CTA BL, no wheezes  Abdomen: Soft, NT, ND  Data Reviewed: Basic Metabolic Panel:  Lab 06/11/12 8119 06/10/12 1145  NA 138 139  K 3.6 4.3  CL 103 103  CO2 27 27  GLUCOSE 109* 101*  BUN 22 25*  CREATININE 0.82 0.73  CALCIUM 8.5 9.0  MG -- --  PHOS -- --   Liver Function Tests:  Lab 06/10/12 1145  AST 70*  ALT 29  ALKPHOS 42  BILITOT 1.3*  PROT 6.7  ALBUMIN 2.8*   No results found for this basename: LIPASE:5,AMYLASE:5 in the last 168 hours No results found for this basename: AMMONIA:5 in the last 168 hours CBC:  Lab 06/11/12 0330 06/10/12 1145  WBC 11.3* 13.4*  NEUTROABS -- 10.6*  HGB 11.9* 14.1  HCT 36.5* 40.9  MCV 91.9 91.3  PLT 128* 140*   Cardiac Enzymes: No results found for this basename: CKTOTAL:5,CKMB:5,CKMBINDEX:5,TROPONINI:5 in the last 168 hours BNP (last 3 results) No results  found for this basename: PROBNP:3 in the last 8760 hours CBG: No results found for this basename: GLUCAP:5 in the last 168 hours  No results found for this or any previous visit (from the past 240 hour(s)).   Studies: Dg Chest 2 View  06/10/2012  *RADIOLOGY REPORT*  Clinical Data: Chest pain.  CHEST - 2 VIEW  Comparison: None.  Findings: Calcified lymph nodes more notable right hilar region suggestive of prior granulomas exposure.  Left base subsegmental atelectasis/scarring suspected.  No infiltrate, congestive heart failure or pneumothorax.  Calcified tortuous aorta.  Heart size within normal limits.  Degenerative changes thoracic  spine with mild loss of height lower thoracic vertebra.  This is incompletely assessed on the present exam.  IMPRESSION: Calcified lymph nodes more notable right hilar region suggestive of prior granulomas exposure.  Left base subsegmental atelectasis/scarring suspected.  No infiltrate, congestive heart failure or pneumothorax.  Calcified tortuous aorta.  Original Report Authenticated By: Fuller Canada, M.D.   Ct Head W Wo Contrast  06/10/2012  *RADIOLOGY REPORT*  Clinical Data: Headache.  Stiff neck.  Bilateral lower extremity weakness.  CT HEAD WITHOUT AND WITH CONTRAST  Technique:  Contiguous axial images were obtained from the base of the skull through the vertex without and with intravenous contrast.  Contrast: OMNIPAQUE IOHEXOL 300 MG/ML  SOLN  Comparison: MRI of the brain dated 07/08/2009  Findings: There is no acute intracranial hemorrhage, infarction, or mass lesion.  There is diffuse cerebral cortical and cerebellar atrophy. Brain parenchyma is otherwise normal.  No osseous abnormality.  No ventricular dilatation.  IMPRESSION: No acute intracranial abnormality.  Diffuse atrophy.  No pathologic enhancement after contrast administration.  Original Report Authenticated By: Gwynn Burly, M.D.   Mr Lumbar Spine Wo Contrast  06/11/2012  *RADIOLOGY REPORT*  Clinical Data: Progressive bilateral leg weakness.  MRI LUMBAR SPINE WITHOUT CONTRAST  Technique:  Multiplanar and multiecho pulse sequences of the lumbar spine were obtained without intravenous contrast.  Comparison: Lumbar MRI 07/23/2010  Findings: Normal lumbar alignment.  Negative for fracture. Moderately large hemangioma in the L1 vertebral body on the right is unchanged.  Heterogeneous bone marrow signal is present but without focal mass lesion.  Conus medullaris is normal and terminates at L1.  T12-L1:  Negative  L1-2:  Mild disc degeneration  L2-3:  Disc degeneration and disc space narrowing with early vertebral spurring.  Mild facet  degeneration with mild narrowing of the spinal canal.  L3-4:  Disc degeneration and spondylosis.  Right-sided disc bulging is present.  Mild facet degeneration.  Mild spinal stenosis.  Mild right foraminal stenosis.  L4-5:  Disc bulging and spurring.  Bilateral facet hypertrophy with mild spinal stenosis.  L5-S1:  Mild disc degeneration.  Bilateral facet degeneration with facet joint hypertrophy causing mild foraminal stenosis on the right and moderate foraminal stenosis on the left.  IMPRESSION: Negative for fracture.  Negative for critical spinal stenosis.  Lumbar degenerative changes are present as described above.  There is right foraminal narrowing at L3-4.  Mild spinal stenosis at L4- 5.  Foraminal stenosis at L5-S1, left greater than right.  Original Report Authenticated By: Camelia Phenes, M.D.    Scheduled Meds:   . beta carotene w/minerals  1 tablet Oral Daily  . meloxicam  7.5 mg Oral Daily  . sodium chloride  3 mL Intravenous Q12H   Continuous Infusions:   Principal Problem:  *Lower extremity weakness Active Problems:  History of prostate cancer  Infection, bartonella  Spinal  stenosis    Time spent: > 30 minutes    Penny Pia  [facility] Pager [phone] If 8PM-8AM, please contact night-coverage at [email or web address], password [email or web address] 06/11/2012, 4:14 PM  LOS: 1 day

## 2012-06-12 ENCOUNTER — Observation Stay (HOSPITAL_COMMUNITY): Payer: Medicare Other

## 2012-06-12 DIAGNOSIS — G629 Polyneuropathy, unspecified: Secondary | ICD-10-CM | POA: Diagnosis present

## 2012-06-12 LAB — SEDIMENTATION RATE: Sed Rate: 66 mm/hr — ABNORMAL HIGH (ref 0–16)

## 2012-06-12 MED ORDER — PREDNISONE 50 MG PO TABS
60.0000 mg | ORAL_TABLET | Freq: Every day | ORAL | Status: DC
  Administered 2012-06-13: 60 mg via ORAL
  Filled 2012-06-12 (×2): qty 1

## 2012-06-12 MED ORDER — DOXYCYCLINE HYCLATE 100 MG PO TABS
100.0000 mg | ORAL_TABLET | Freq: Two times a day (BID) | ORAL | Status: DC
  Filled 2012-06-12 (×2): qty 1

## 2012-06-12 MED ORDER — DOXYCYCLINE HYCLATE 100 MG PO CAPS
100.0000 mg | ORAL_CAPSULE | Freq: Two times a day (BID) | ORAL | Status: DC
  Administered 2012-06-12 – 2012-06-13 (×3): 100 mg via ORAL
  Filled 2012-06-12 (×6): qty 1

## 2012-06-12 MED ORDER — RIFAMPIN 300 MG PO CAPS
300.0000 mg | ORAL_CAPSULE | Freq: Every day | ORAL | Status: DC
  Administered 2012-06-12 – 2012-06-13 (×2): 300 mg via ORAL
  Filled 2012-06-12 (×2): qty 1

## 2012-06-12 NOTE — Progress Notes (Signed)
[facility] PROGRESS NOTE  Raymond Valdez RUE:[id number] DOB: [birth year] DOA: 06/10/2012 PCP: Redmond Baseman, MD  Assessment/Plan: Principal Problem:  *Lower extremity weakness in a patient with a history of chronic peripheral neuropathy and mild lumbar spinal stenosis as well as Bartonella infection  Etiology unclear but likely includes peripheral neuropathy, lumbar spinal stenosis and Bartonella infection.  ANA negative.  ESR either not collected or not ordered.  Will order.  Total CK either not collected or not ordered.  Will order.  B12 248. B1 in process.  Folate wither not collected or not ordered.  Will order.  Vitamin D panel either not collected or not ordered.  Will order.  Cervical spine MRI also not done/not ordered.  Nerve conduction tests ordered, results pending.  Evaluated by neurologist on 06/10/12: No indication for LP as his presentation is not felt to be consistent with GBS.  PT evaluation ordered. Active Problems:  History of prostate cancer  Lumbar spine MRI unremarkable except for mild spinal stenosis.  Infection, bartonella  Evaluated by Dr. Ninetta Lights 06/10/12.  No empiric antibiotics initially recommended.  Last round of doxycycline given in May.  Dr. Ninetta Lights contacted specialist at [facility], repeat labs pending at [facility] lab.  Refused further treatment with doxycycline/rifampin, but after speaking with him today, now willing to take.  F/U with [facility] at d/c per ID recs.  Spinal stenosis  Only mild spinal stenosis noted on lumbar spine MRI.  Cervical spine MRI ordered.   Code Status: Full. Family Communication: Spoke with wife at bedside.  Disposition Plan: Pending clinical improvement and further recommendations from specialist involved.    Brief narrative: 78 y/o CM with h/o prostate cancer s/p prostate surgery (which has left patient incontinent), with history of spinal stenosis, and prior infection with Bartonella vinsonii ~ 2  years ago of which he has had treatment with antibiotics on and off with doxycycline reportedly. His most recent cycle of doxycycline was finished in May. He was admitted on 06/10/12 with complaints of 2 weeks progressive decrease in strength BLE.   Consultants:  Dr. Noel Christmas, Neurology  Dr. [name], ID  Procedures:  None.  Antibiotics: Anti-infectives    None     HPI/Subjective: Dr. Enid Derry feels weak and has noticed some speech changes.  No dysphagia.  No dyspnea.    Objective: Filed Vitals:   06/10/12 2132 06/11/12 0623 06/11/12 1345 06/12/12 0514  BP: 112/56 124/74 132/67 129/76  Pulse: 81 82 82 68  Temp: 99.5 F (37.5 C) 99.3 F (37.4 C) 98.7 F (37.1 C) 99.4 F (37.4 C)  TempSrc: Oral Oral  Oral  Resp: 18 18 16 16   Height:      Weight:      SpO2: 97% 95% 98% 96%    Intake/Output Summary (Last 24 hours) at 06/12/12 1150 Last data filed at 06/12/12 0900  Gross per 24 hour  Intake    480 ml  Output    850 ml  Net   -370 ml    Exam: Gen:  NAD Cardiovascular:  RRR, No M/R/G Respiratory: Lungs CTAB Gastrointestinal: Abdomen soft, NT/ND with normal active bowel sounds. Extremities: No C/E/C Neuro: + generalized weakness, pronator drift.  LE weaker than UE.  Data Reviewed: Basic Metabolic Panel:  Lab 06/11/12 1478 06/10/12 1145  NA 138 139  K 3.6 4.3  CL 103 103  CO2 27 27  GLUCOSE 109* 101*  BUN 22 25*  CREATININE 0.82 0.73  CALCIUM 8.5 9.0  MG -- --  PHOS -- --   GFR Estimated Creatinine Clearance: 68.4 ml/min (by C-G formula based on Cr of 0.82). Liver Function Tests:  Lab 06/10/12 1145  AST 70*  ALT 29  ALKPHOS 42  BILITOT 1.3*  PROT 6.7  ALBUMIN 2.8*   Coagulation profile  Lab 06/10/12 1145  INR 1.22  PROTIME --    CBC:  Lab 06/11/12 0330 06/10/12 1145  WBC 11.3* 13.4*  NEUTROABS -- 10.6*  HGB 11.9* 14.1  HCT 36.5* 40.9  MCV 91.9 91.3  PLT 128* 140*   Anemia work up  [employer] 06/10/12 1941  VITAMINB12  248  FOLATE --  FERRITIN --  TIBC --  IRON --  RETICCTPCT --   Microbiology No results found for this or any previous visit (from the past 240 hour(s)).   Studies:  Dg Chest 2 View 06/10/2012 IMPRESSION: Calcified lymph nodes more notable right hilar region suggestive of prior granulomas exposure.  Left base subsegmental atelectasis/scarring suspected.  No infiltrate, congestive heart failure or pneumothorax.  Calcified tortuous aorta.  Original Report Authenticated By: Fuller Canada, M.D.    Ct Head W Wo Contrast 06/10/2012 IMPRESSION: No acute intracranial abnormality.  Diffuse atrophy.  No pathologic enhancement after contrast administration.  Original Report Authenticated By: Gwynn Burly, M.D.    Mr Lumbar Spine Wo Contrast 06/11/2012 IMPRESSION: Negative for fracture.  Negative for critical spinal stenosis.  Lumbar degenerative changes are present as described above.  There is right foraminal narrowing at L3-4.  Mild spinal stenosis at L4- 5.  Foraminal stenosis at L5-S1, left greater than right.  Original Report Authenticated By: Camelia Phenes, M.D.    Scheduled Meds:   . beta carotene w/minerals  1 tablet Oral Daily  . meloxicam  7.5 mg Oral Daily  . sodium chloride  3 mL Intravenous Q12H   Continuous Infusions:   Time spent: 40 minutes   LOS: 2 days   RAMA,CHRISTINA  [facility] Pager [phone].  If 8PM-8AM, please contact night-coverage at [email or web address], password [facility] 06/12/2012, 11:50 AM  LOS: 2 days

## 2012-06-12 NOTE — Progress Notes (Signed)
INFECTIOUS DISEASE PROGRESS NOTE  ID: Raymond Valdez is a 78 y.o. male with  Principal Problem:  *Lower extremity weakness Active Problems:  History of prostate cancer  Infection, bartonella  Spinal stenosis  Peripheral neuropathy, chronic.  Subjective: Resting quietly.   Abtx:  Anti-infectives     Start     Dose/Rate Route Frequency Ordered Stop   06/12/12 1330   doxycycline (VIBRAMYCIN) capsule 100 mg        100 mg Oral Every 12 hours 06/12/12 1251     06/12/12 1300   doxycycline (VIBRA-TABS) tablet 100 mg  Status:  Discontinued        100 mg Oral Every 12 hours 06/12/12 1228 06/12/12 1249   06/12/12 1230   rifampin (RIFADIN) capsule 300 mg        300 mg Oral Daily 06/12/12 1228            Medications:  Scheduled:   . beta carotene w/minerals  1 tablet Oral Daily  . doxycycline  100 mg Oral Q12H  . meloxicam  7.5 mg Oral Daily  . predniSONE  60 mg Oral Q breakfast  . rifampin  300 mg Oral Daily  . sodium chloride  3 mL Intravenous Q12H  . DISCONTD: doxycycline  100 mg Oral Q12H    Objective: Vital signs in last 24 hours: Temp:  [99.1 F (37.3 C)-99.4 F (37.4 C)] 99.1 F (37.3 C) (07/17 1300) Pulse Rate:  [64-68] 64  (07/17 1300) Resp:  [16-18] 18  (07/17 1300) BP: (125-129)/(76) 125/76 mmHg (07/17 1300) SpO2:  [96 %] 96 % (07/17 1300)   General appearance: no distress  Lab Results  Basename 06/11/12 0330 06/10/12 1145  WBC 11.3* 13.4*  HGB 11.9* 14.1  HCT 36.5* 40.9  NA 138 139  K 3.6 4.3  CL 103 103  CO2 27 27  BUN 22 25*  CREATININE 0.82 0.73  GLU -- --   Liver Panel  Basename 06/10/12 1145  PROT 6.7  ALBUMIN 2.8*  AST 70*  ALT 29  ALKPHOS 42  BILITOT 1.3*  BILIDIR --  IBILI --   Sedimentation Rate  Basename 06/12/12 1230  ESRSEDRATE 66*   C-Reactive Protein No results found for this basename: CRP:2 in the last 72 hours  Microbiology: No results found for this or any previous visit (from the past 240  hour(s)).  Studies/Results: Ct Head W Wo Contrast  06/10/2012  *RADIOLOGY REPORT*  Clinical Data: Headache.  Stiff neck.  Bilateral lower extremity weakness.  CT HEAD WITHOUT AND WITH CONTRAST  Technique:  Contiguous axial images were obtained from the base of the skull through the vertex without and with intravenous contrast.  Contrast: OMNIPAQUE IOHEXOL 300 MG/ML  SOLN  Comparison: MRI of the brain dated 07/08/2009  Findings: There is no acute intracranial hemorrhage, infarction, or mass lesion.  There is diffuse cerebral cortical and cerebellar atrophy. Brain parenchyma is otherwise normal.  No osseous abnormality.  No ventricular dilatation.  IMPRESSION: No acute intracranial abnormality.  Diffuse atrophy.  No pathologic enhancement after contrast administration.  Original Report Authenticated By: Gwynn Burly, M.D.   Mr Lumbar Spine Wo Contrast  06/11/2012  *RADIOLOGY REPORT*  Clinical Data: Progressive bilateral leg weakness.  MRI LUMBAR SPINE WITHOUT CONTRAST  Technique:  Multiplanar and multiecho pulse sequences of the lumbar spine were obtained without intravenous contrast.  Comparison: Lumbar MRI 07/23/2010  Findings: Normal lumbar alignment.  Negative for fracture. Moderately large hemangioma in the L1 vertebral body  on the right is unchanged.  Heterogeneous bone marrow signal is present but without focal mass lesion.  Conus medullaris is normal and terminates at L1.  T12-L1:  Negative  L1-2:  Mild disc degeneration  L2-3:  Disc degeneration and disc space narrowing with early vertebral spurring.  Mild facet degeneration with mild narrowing of the spinal canal.  L3-4:  Disc degeneration and spondylosis.  Right-sided disc bulging is present.  Mild facet degeneration.  Mild spinal stenosis.  Mild right foraminal stenosis.  L4-5:  Disc bulging and spurring.  Bilateral facet hypertrophy with mild spinal stenosis.  L5-S1:  Mild disc degeneration.  Bilateral facet degeneration with facet joint  hypertrophy causing mild foraminal stenosis on the right and moderate foraminal stenosis on the left.  IMPRESSION: Negative for fracture.  Negative for critical spinal stenosis.  Lumbar degenerative changes are present as described above.  There is right foraminal narrowing at L3-4.  Mild spinal stenosis at L4- 5.  Foraminal stenosis at L5-S1, left greater than right.  Original Report Authenticated By: Camelia Phenes, M.D.     Assessment/Plan: Weakness  B vinsonii  Spoke with colleague at [facility]. This has been seen in veterinarians, requires special media to isolate from blood.  Will ask pt to provide his records of his serologies as well as contact for [facility] lab [employer] ). He currently has repeat labs pending there.  Left message with [facility], they will forward to Dr Arville Lime.  Started on doxy/rifampin, this AM by Dr Darnelle Catalan. My great appreciation to her.  Ask him to f/u with [facility] at d/c.    [name] Infectious Diseases [phone] 06/12/2012, 3:49 PM   LOS: 2 days

## 2012-06-13 ENCOUNTER — Observation Stay (HOSPITAL_COMMUNITY): Payer: Medicare Other

## 2012-06-13 DIAGNOSIS — IMO0002 Reserved for concepts with insufficient information to code with codable children: Secondary | ICD-10-CM | POA: Diagnosis present

## 2012-06-13 DIAGNOSIS — G9340 Encephalopathy, unspecified: Secondary | ICD-10-CM | POA: Diagnosis present

## 2012-06-13 LAB — COMPREHENSIVE METABOLIC PANEL
AST: 36 U/L (ref 0–37)
Alkaline Phosphatase: 73 U/L (ref 39–117)
CO2: 25 mEq/L (ref 19–32)
Chloride: 99 mEq/L (ref 96–112)
Creatinine, Ser: 0.65 mg/dL (ref 0.50–1.35)
GFR calc non Af Amer: 89 mL/min — ABNORMAL LOW (ref >90)
Potassium: 3.6 mEq/L (ref 3.5–5.1)
Total Bilirubin: 0.9 mg/dL (ref 0.3–1.2)

## 2012-06-13 LAB — VITAMIN D 25 HYDROXY (VIT D DEFICIENCY, FRACTURES): Vit D, 25-Hydroxy: 33 ng/mL (ref 30–89)

## 2012-06-13 LAB — TSH: TSH: 2.519 u[IU]/mL (ref 0.350–4.500)

## 2012-06-13 LAB — VITAMIN B1: Vitamin B1 (Thiamine): 12 nmol/L (ref 8–30)

## 2012-06-13 MED ORDER — PREDNISONE 50 MG PO TABS
50.0000 mg | ORAL_TABLET | Freq: Every day | ORAL | Status: DC
  Administered 2012-06-14: 50 mg via ORAL
  Filled 2012-06-13 (×2): qty 1

## 2012-06-13 MED ORDER — GADOBENATE DIMEGLUMINE 529 MG/ML IV SOLN
16.0000 mL | Freq: Once | INTRAVENOUS | Status: AC | PRN
  Administered 2012-06-13: 16 mL via INTRAVENOUS

## 2012-06-13 MED ORDER — DOXYCYCLINE HYCLATE 100 MG PO CAPS
100.0000 mg | ORAL_CAPSULE | Freq: Two times a day (BID) | ORAL | Status: DC
  Administered 2012-06-13 – 2012-06-14 (×3): 100 mg via ORAL
  Filled 2012-06-13 (×3): qty 1

## 2012-06-13 MED ORDER — RIFAMPIN 300 MG PO CAPS
300.0000 mg | ORAL_CAPSULE | Freq: Every day | ORAL | Status: DC
  Administered 2012-06-13 – 2012-06-14 (×2): 300 mg via ORAL
  Filled 2012-06-13 (×2): qty 1

## 2012-06-13 MED ORDER — DOXYCYCLINE HYCLATE 100 MG PO TABS: 100.0000 mg | ORAL_TABLET | Freq: Two times a day (BID) | ORAL | Status: DC

## 2012-06-13 NOTE — Progress Notes (Signed)
OT Cancellation Note  Treatment cancelled today due to patient receiving procedure or test. Will check back as schedule permits.  Maghan Jessee A OTR/L [phone] 06/13/2012, 2:34 PM

## 2012-06-13 NOTE — Progress Notes (Addendum)
[facility] PROGRESS NOTE  FERNANDEZ KENLEY BJY:[id number] DOB: 09/20/1929 DOA: 06/10/2012 PCP: Redmond Baseman, MD  Assessment/Plan: Principal Problem:  *Lower extremity weakness in a patient with a history of chronic peripheral neuropathy and mild lumbar spinal stenosis as well as Bartonella infection  Etiology unclear but likely includes peripheral neuropathy, lumbar spinal stenosis or Bartonella infection, or some combination thereof.  Also C-spine MRI shows a fluid filled C1 mass of uncertain significance.  ANA negative; ESR 66; Total CK: 118; B12 248. B1 in process; Folate pending; Vitamin D pending.  Cervical spine MRI results as noted below.  Nerve conduction tests ordered, not able to be done in the hospital.  Evaluated by neurologist on 06/10/12: No indication for LP as his presentation is not felt to be consistent with GBS.  MRI of brain ordered by neurology consultant.  PT/OT evaluation ordered, evaluation pending.  Steroids started empirically on 06/12/12.   Active Problems:  C1 mass  Unclear etiology, ? Infectious  ID recommends neurosurgical evaluation.  Spoke with Dr. Phoebe Perch who did not feel there was any neurosurgical indication, or that his symptoms could be explained by the abnormality seen on MRI.  Will ask IR to review films to see if they can aspirate the fluid collection and send for culture.  Encephalopathy acute, toxic and metabolic  AMS with hallucinations noted by nursing staff overnight 06/12/12.  Not likely to be from prednisone, since only given 1st dose this a.m.  Rifampin can cause psychosis.  Check TSH and RPR.  History of prostate cancer  Lumbar spine MRI unremarkable except for mild spinal stenosis.  Infection, bartonella  Evaluated by Dr. Ninetta Lights 06/10/12.  No empiric antibiotics initially recommended, but Dr. Ninetta Lights ultimately recommended a round of doxycycline/rifampin.  Last round of doxycycline given in May.  Dr. Ninetta Lights  contacted specialist at [facility], repeat labs pending at [facility] lab.  Continue doxycycline/rifampin.  F/U with [facility] at d/c per ID recs.  Spinal stenosis  Only mild spinal stenosis noted on lumbar spine MRI.  Cervical spine MRI does not show spinal stenosis.   Code Status: Full. Family Communication: Spoke with wife and daughter at bedside.  Disposition Plan: Pending clinical improvement and further recommendations from specialist involved.    Brief narrative: 78 y/o CM with h/o prostate cancer s/p prostate surgery (which has left patient incontinent), with history of spinal stenosis, and prior infection with Bartonella vinsonii ~ 2 years ago of which he has had treatment with antibiotics on and off with doxycycline reportedly. His most recent cycle of doxycycline was finished in May. He was admitted on 06/10/12 with complaints of 2 weeks progressive decrease in strength BLE.   Consultants:  Dr. Noel Christmas, Neurology  Dr. [name], ID  Dr. Phoebe Perch, Neurosurgery  Procedures:  None.  Antibiotics: Anti-infectives     Start     Dose/Rate Route Frequency Ordered Stop   06/12/12 1330   doxycycline (VIBRAMYCIN) capsule 100 mg        100 mg Oral Every 12 hours 06/12/12 1251     06/12/12 1300   doxycycline (VIBRA-TABS) tablet 100 mg  Status:  Discontinued        100 mg Oral Every 12 hours 06/12/12 1228 06/12/12 1249   06/12/12 1230   rifampin (RIFADIN) capsule 300 mg        300 mg Oral Daily 06/12/12 1228           HPI/Subjective: Dr. Enid Derry feels weak and has a stiff neck.  No headache  or neck pain.  He has had hallucinations and delirium overnight.    Objective: Filed Vitals:   06/12/12 0514 06/12/12 1300 06/12/12 2110 06/13/12 0501  BP: 129/76 125/76 151/62 144/82  Pulse: 68 64 68 76  Temp: 99.4 F (37.4 C) 99.1 F (37.3 C) 98.5 F (36.9 C) 98.8 F (37.1 C)  TempSrc: Oral  Oral Oral  Resp: 16 18 16 16   Height:      Weight:      SpO2: 96% 96% 95%  94%    Intake/Output Summary (Last 24 hours) at 06/13/12 1033 Last data filed at 06/13/12 0700  Gross per 24 hour  Intake    600 ml  Output   2250 ml  Net  -1650 ml    Exam: Gen:  NAD Cardiovascular:  RRR, No M/R/G Respiratory: Lungs CTAB Gastrointestinal: Abdomen soft, NT/ND with normal active bowel sounds. Extremities: No C/E/C Neuro: + generalized weakness, pronator drift.  LE weaker than UE.  Grip strength good and equal.  Neck stiff, but can move laterally.  + frontal release signs noted by neuro.  Data Reviewed: Basic Metabolic Panel:  Lab 06/11/12 2956 06/10/12 1145  NA 138 139  K 3.6 4.3  CL 103 103  CO2 27 27  GLUCOSE 109* 101*  BUN 22 25*  CREATININE 0.82 0.73  CALCIUM 8.5 9.0  MG -- --  PHOS -- --   GFR Estimated Creatinine Clearance: 68.4 ml/min (by C-G formula based on Cr of 0.82). Liver Function Tests:  Lab 06/10/12 1145  AST 70*  ALT 29  ALKPHOS 42  BILITOT 1.3*  PROT 6.7  ALBUMIN 2.8*   Coagulation profile  Lab 06/10/12 1145  INR 1.22  PROTIME --    CBC:  Lab 06/11/12 0330 06/10/12 1145  WBC 11.3* 13.4*  NEUTROABS -- 10.6*  HGB 11.9* 14.1  HCT 36.5* 40.9  MCV 91.9 91.3  PLT 128* 140*   Anemia work up  [employer] 06/10/12 1941  VITAMINB12 248  FOLATE --  FERRITIN --  TIBC --  IRON --  RETICCTPCT --   Microbiology No results found for this or any previous visit (from the past 240 hour(s)).   Studies:  Dg Chest 2 View 06/10/2012 IMPRESSION: Calcified lymph nodes more notable right hilar region suggestive of prior granulomas exposure.  Left base subsegmental atelectasis/scarring suspected.  No infiltrate, congestive heart failure or pneumothorax.  Calcified tortuous aorta.  Original Report Authenticated By: Fuller Canada, M.D.    Ct Head W Wo Contrast 06/10/2012 IMPRESSION: No acute intracranial abnormality.  Diffuse atrophy.  No pathologic enhancement after contrast administration.  Original Report Authenticated By: Gwynn Burly, M.D.    Mr Lumbar Spine Wo Contrast 06/11/2012 IMPRESSION: Negative for fracture.  Negative for critical spinal stenosis.  Lumbar degenerative changes are present as described above.  There is right foraminal narrowing at L3-4.  Mild spinal stenosis at L4- 5.  Foraminal stenosis at L5-S1, left greater than right.  Original Report Authenticated By: Camelia Phenes, M.D.    Mr Cervical Spine Wo Contrast 06/13/2012  IMPRESSION:  Fluid noted within the C1 lateral mass - C2 articulation.  Edema within the adjacent posterior soft tissues/paraspinal muscles are more notable on the left.  Given the patient's history of recent bacterial infection, infection of the joint space and surrounding soft tissue not excluded.  Mild to slightly moderate degenerative changes most notable C4-5 through C6-7 as detailed above.  This has been made a PRA call report utilizing dashboard  call feature.  Original Report Authenticated By: Fuller Canada, M.D.   Scheduled Meds:    . beta carotene w/minerals  1 tablet Oral Daily  . doxycycline  100 mg Oral Q12H  . meloxicam  7.5 mg Oral Daily  . predniSONE  60 mg Oral Q breakfast  . rifampin  300 mg Oral Daily  . sodium chloride  3 mL Intravenous Q12H  . DISCONTD: doxycycline  100 mg Oral Q12H   Continuous Infusions:   Time spent: 30 minutes   LOS: 3 days   Shruthi Northrup  [facility] Pager [phone].  If 8PM-8AM, please contact night-coverage at [email or web address], password [facility] 06/13/2012, 10:33 AM  LOS: 3 days

## 2012-06-13 NOTE — Progress Notes (Signed)
INFECTIOUS DISEASE PROGRESS NOTE  ID: Raymond Valdez is a 78 y.o. male with  Principal Problem:  *Lower extremity weakness Active Problems:  History of prostate cancer  Infection, bartonella  Spinal stenosis  Peripheral neuropathy, chronic.  Encephalopathy acute, toxic and metabolic  Subjective: Becoming confused.   Abtx:  Anti-infectives     Start     Dose/Rate Route Frequency Ordered Stop   06/12/12 1330   doxycycline (VIBRAMYCIN) capsule 100 mg        100 mg Oral Every 12 hours 06/12/12 1251     06/12/12 1300   doxycycline (VIBRA-TABS) tablet 100 mg  Status:  Discontinued        100 mg Oral Every 12 hours 06/12/12 1228 06/12/12 1249   06/12/12 1230   rifampin (RIFADIN) capsule 300 mg        300 mg Oral Daily 06/12/12 1228            Medications:  Scheduled:   . beta carotene w/minerals  1 tablet Oral Daily  . doxycycline  100 mg Oral Q12H  . meloxicam  7.5 mg Oral Daily  . predniSONE  50 mg Oral Q breakfast  . rifampin  300 mg Oral Daily  . sodium chloride  3 mL Intravenous Q12H  . DISCONTD: doxycycline  100 mg Oral Q12H  . DISCONTD: predniSONE  60 mg Oral Q breakfast    Objective: Vital signs in last 24 hours: Temp:  [98.5 F (36.9 C)-99.1 F (37.3 C)] 98.8 F (37.1 C) (07/18 0501) Pulse Rate:  [64-76] 76  (07/18 0501) Resp:  [16-18] 16  (07/18 0501) BP: (125-151)/(62-82) 144/82 mmHg (07/18 0501) SpO2:  [94 %-96 %] 94 % (07/18 0501)   General appearance: alert and delirious Neck: does not move neck Resp: clear to auscultation bilaterally Cardio: regular rate and rhythm GI: normal findings: bowel sounds normal and soft, non-tender  Lab Results  Basename 06/11/12 0330  WBC 11.3*  HGB 11.9*  HCT 36.5*  NA 138  K 3.6  CL 103  CO2 27  BUN 22  CREATININE 0.82  GLU --   Liver Panel No results found for this basename: PROT:2,ALBUMIN:2,AST:2,ALT:2,ALKPHOS:2,BILITOT:2,BILIDIR:2,IBILI:2 in the last 72 hours Sedimentation Rate  Basename  06/12/12 1230  ESRSEDRATE 66*   C-Reactive Protein No results found for this basename: CRP:2 in the last 72 hours  Microbiology: No results found for this or any previous visit (from the past 240 hour(s)).  Studies/Results: Mr Cervical Spine Wo Contrast  06/13/2012  *RADIOLOGY REPORT*  Clinical Data: Bilateral lower extremity weakness.  Recent bacterial infection.  History prostate cancer.  MRI CERVICAL SPINE WITHOUT CONTRAST  Technique:  Multiplanar and multiecho pulse sequences of the cervical spine, to include the craniocervical junction and cervicothoracic junction, were obtained according to standard protocol without intravenous contrast.  Comparison: 07/08/2009 MR brain.  No comparison cervical spine MR.  Findings: Cervical medullary junction unremarkable.  No focal cervical cord signal abnormality.  Fluid noted within the C1 lateral mass - C2 articulation.  Edema within the adjacent posterior soft tissues/paraspinal muscles are more notable on the left.  Given the patient's history of recent bacterial infection, infection of the joint space and surrounding soft tissue not excluded.  This can be re-evaluated with close follow-up if there are progressive symptoms.  The bony structures appear intact.  No other findings to suggest diskitis/osteomyelitis.  C2-3:  Minimal bulge greater to the left.  Minimal left foraminal narrowing. Right facet joint may be fused.  C3-4: Small  spur greater to the left.  Mild left-sided cord contact.  Mild left foraminal narrowing.  C4-5:  Broad-based disc osteophyte complex greater to the left. Minimal left-sided cord flattening.  Uncinate spurring.  Mild to slightly moderate bilateral foraminal narrowing.  C5-6:  Small broad-based disc osteophyte greater to the left. Minimal left-sided cord flattening.  Uncinate spurring.  Moderate bilateral foraminal narrowing.  C6-7:  Small broad-based disc osteophyte greater to the right.  No significant cord flattening.  Uncinate  spurring.  Moderate bilateral foraminal narrowing.  C7-T1:  Minimal bulge.  IMPRESSION:  Fluid noted within the C1 lateral mass - C2 articulation.  Edema within the adjacent posterior soft tissues/paraspinal muscles are more notable on the left.  Given the patient's history of recent bacterial infection, infection of the joint space and surrounding soft tissue not excluded.  Mild to slightly moderate degenerative changes most notable C4-5 through C6-7 as detailed above.  This has been made a PRA call report utilizing dashboard call feature.  Original Report Authenticated By: Fuller Canada, M.D.     Assessment/Plan: C1-2 mass (? Abscess) B vinsonii Day 2 doxy/rif-  would stop anbx, have neurosurgery eval pt. Await call from [facility] MD I doubt the process in his neck is related to Bartonella Explained to daughter, wife.   Raymond Valdez Infectious Diseases [phone] 06/13/2012, 11:52 AM   LOS: 3 days

## 2012-06-13 NOTE — ED Provider Notes (Signed)
Medical screening examination/treatment/procedure(s) were conducted as a shared visit with non-physician practitioner(s) and myself.  I personally evaluated the patient during the encounter   Raymond Bucco, MD 06/13/12 1148

## 2012-06-13 NOTE — Progress Notes (Signed)
PT Cancellation Note  Treatment cancelled today due to patient receiving procedure or test. Pt to have MRI of cervical mass with neurosurgery consult, so will defer PT eval til those results are posted.   Please advise of mobility precautions. Thank you!  Bayard Hugger. [place], [place] [phone] 06/13/2012, 1:01 PM

## 2012-06-13 NOTE — Progress Notes (Addendum)
[facility] PROGRESS NOTE    SUBJECTIVE   Over night family members noted he has been having visual hallucinations of people being in bed with him and in the room.  He is aware these are not real.  He has nor had hallucinations in the past. He was started on Doxycycline and Rifampin yesterday.  First dose of Prednisone 60 mg was given this am.   Wife states over past year he has had increased shuffled gait, unable to get up from seated position , does tend to fall backwards at times.     OBJECTIVE   Vital signs in last 24 hours: Temp:  [98.5 F (36.9 C)-99.1 F (37.3 C)] 98.8 F (37.1 C) (07/18 0501) Pulse Rate:  [64-76] 76  (07/18 0501) Resp:  [16-18] 16  (07/18 0501) BP: (125-151)/(62-82) 144/82 mmHg (07/18 0501) SpO2:  [94 %-96 %] 94 % (07/18 0501)  Intake/Output from previous day: 07/17 0701 - 07/18 0700 In: 840 [P.O.:840] Out: 2250 [Urine:2250] Intake/Output this shift:   Nutritional status: Cardiac  Past Medical History  Diagnosis Date  . Macular degeneration   . History of prostate cancer   . Infection, bartonella   . Spinal stenosis     Neurologic Exam:  Mental Status: Alert, oriented X 3.  Speech fluent without evidence of aphasia. Able to follow 3 step commands without difficulty. Thought content appropriate Cranial Nerves: II-Visual fields grossly intact. III/IV/VI-Extraocular movements intact.  Pupils reactive bilaterally. Ptosis not present. V/VII-Smile symmetric VIII-grossly intact IX/X-normal gag XI-bilateral shoulder shrug XII-midline tongue extension --He holds his neck to the right with noted increased left SCM tone.  He is not complaining of neck pain but I am not able to passively move his neck to the left or right.  Motor: He is able to lift arms off the bed and hold antigravity without drift.  Proximal shoulder strength is 4/5 and distal bicep/tricep strength is 5/5.  Grip bilaterally weak.   Hip flexors show 4/5 strength with difficulty flexing beyond 45 degrees, knee flexion and extension 4/5 and DF/PF 5/5.  His tone is increased throughout, positive cog wheeling, positive palmomental. He does have a postural tremor however wife and daughter state this has been present for multiple years.   Sensory: Pinprick and light touch intact throughout, bilaterally Deep Tendon Reflexes: 2+ and symmetric throughout including achilles.      Plantars:      Right:  upgoing   Left:  Downgoing Cerebellar: finger-to-nose shows limited ROM but no dysmetria noted.   Lab Results: Results for orders placed during the hospital encounter of 06/10/12 (from the past 24 hour(s))  COMPREHENSIVE METABOLIC PANEL     Status: Abnormal   Collection Time   06/13/12 11:20 AM      Component Value Range   Sodium 135  135 - 145 mEq/L   Potassium 3.6  3.5 - 5.1 mEq/L   Chloride 99  96 - 112 mEq/L   CO2 25  19 - 32 mEq/L   Glucose, Bld 189 (*) 70 - 99 mg/dL   BUN 16  6 - 23 mg/dL   Creatinine, Ser 4.78  0.50 - 1.35 mg/dL   Calcium 8.5  8.4 - 29.5 mg/dL   Total Protein 6.2  6.0 - 8.3 g/dL  Albumin 2.2 (*) 3.5 - 5.2 g/dL   AST 36  0 - 37 U/L   ALT 36  0 - 53 U/L   Alkaline Phosphatase 73  39 - 117 U/L   Total Bilirubin 0.9  0.3 - 1.2 mg/dL   GFR calc non Af Amer 89 (*) >90 mL/min   GFR calc Af Amer >90  >90 mL/min   Lipid Panel No results found for this basename: CHOL,TRIG,HDL,CHOLHDL,VLDL,LDLCALC in the last 72 hours  Studies/Results: Mr Cervical Spine Wo Contrast  06/13/2012  *RADIOLOGY REPORT*  Clinical Data: Bilateral lower extremity weakness.  Recent bacterial infection.  History prostate cancer.  MRI CERVICAL SPINE WITHOUT CONTRAST  Technique:  Multiplanar and multiecho pulse sequences of the cervical spine, to include the craniocervical junction and cervicothoracic junction, were obtained according to standard protocol without intravenous contrast.  Comparison: 07/08/2009 MR brain.  No comparison  cervical spine MR.  Findings: Cervical medullary junction unremarkable.  No focal cervical cord signal abnormality.  Fluid noted within the C1 lateral mass - C2 articulation.  Edema within the adjacent posterior soft tissues/paraspinal muscles are more notable on the left.  Given the patient's history of recent bacterial infection, infection of the joint space and surrounding soft tissue not excluded.  This can be re-evaluated with close follow-up if there are progressive symptoms.  The bony structures appear intact.  No other findings to suggest diskitis/osteomyelitis.  C2-3:  Minimal bulge greater to the left.  Minimal left foraminal narrowing. Right facet joint may be fused.  C3-4: Small spur greater to the left.  Mild left-sided cord contact.  Mild left foraminal narrowing.  C4-5:  Broad-based disc osteophyte complex greater to the left. Minimal left-sided cord flattening.  Uncinate spurring.  Mild to slightly moderate bilateral foraminal narrowing.  C5-6:  Small broad-based disc osteophyte greater to the left. Minimal left-sided cord flattening.  Uncinate spurring.  Moderate bilateral foraminal narrowing.  C6-7:  Small broad-based disc osteophyte greater to the right.  No significant cord flattening.  Uncinate spurring.  Moderate bilateral foraminal narrowing.  C7-T1:  Minimal bulge.  IMPRESSION:  Fluid noted within the C1 lateral mass - C2 articulation.  Edema within the adjacent posterior soft tissues/paraspinal muscles are more notable on the left.  Given the patient's history of recent bacterial infection, infection of the joint space and surrounding soft tissue not excluded.  Mild to slightly moderate degenerative changes most notable C4-5 through C6-7 as detailed above.  This has been made a PRA call report utilizing dashboard call feature.  Original Report Authenticated By: Fuller Canada, M.D.    Medications:     Scheduled:   . beta carotene w/minerals  1 tablet Oral Daily  . doxycycline  100  mg Oral Q12H  . meloxicam  7.5 mg Oral Daily  . predniSONE  60 mg Oral Q breakfast  . rifampin  300 mg Oral Daily  . sodium chloride  3 mL Intravenous Q12H  . DISCONTD: doxycycline  100 mg Oral Q12H    Assessment/Plan:    Patient Active Hospital Problem List: Lower extremity weakness (06/10/2012)   Assessment: Etiology unclear at this time. MRI C-Spine shows no significant canal stenosis and MRI L-Spine shows no focal critical spinal stenosis. Patients DTR remain 2+ through out on exam and his strength is 4/5 through out with weakness noted to be greater in proximal muscles than distal.  He does tend to hold his head turned to the right with increased tone noted in left SCM.  He has remained A-fbrile with slight elevated WBC while in the hospital. At this time CIDP remains in the differential given his proximal weakness in addition to peripheral neuropathy.     Plan: Will order MRI brain to look for any intracranial abnormalities. If MRI brain is negative no further acute neurological work up in the in-patient setting recommended.  Patient should be followed up in out patient neurological setting for EMG/NCV.  Infection, bartonella (02/21/2012)   Assessment/ Plan:  ID following B vinsonii. Started on Rifampin, Doxycycline and Prednisone.   Peripheral neuropathy, chronic. (06/12/2012)   Assessment/ Plan:  ANA (negative), B1 (pending), B12 (248), Folate (pending) and Vit D (33). Continue to recommend out patient EMG/NCV.    Keiffer Piper PA-C [facility] [phone]  06/13/2012, 10:30 AM

## 2012-06-14 LAB — BASIC METABOLIC PANEL
BUN: 20 mg/dL (ref 6–23)
CO2: 25 mEq/L (ref 19–32)
Calcium: 8.8 mg/dL (ref 8.4–10.5)
Chloride: 103 mEq/L (ref 96–112)
Creatinine, Ser: 0.62 mg/dL (ref 0.50–1.35)
Glucose, Bld: 104 mg/dL — ABNORMAL HIGH (ref 70–99)

## 2012-06-14 LAB — CBC
HCT: 40.9 % (ref 39.0–52.0)
MCH: 30.5 pg (ref 26.0–34.0)
MCHC: 33.7 g/dL (ref 30.0–36.0)
MCV: 90.5 fL (ref 78.0–100.0)
Platelets: 194 10*3/uL (ref 150–400)
RDW: 13.7 % (ref 11.5–15.5)

## 2012-06-14 LAB — VITAMIN D 1,25 DIHYDROXY: Vitamin D3 1, 25 (OH)2: 25 pg/mL

## 2012-06-14 MED ORDER — PREDNISONE 50 MG PO TABS: ORAL_TABLET | ORAL | Status: AC

## 2012-06-14 MED ORDER — POLYVINYL ALCOHOL 1.4 % OP SOLN: 1.0000 [drp] | OPHTHALMIC | Status: AC | PRN

## 2012-06-14 MED ORDER — QUETIAPINE FUMARATE 50 MG PO TABS: 50.0000 mg | ORAL_TABLET | Freq: Every day | ORAL | Status: DC

## 2012-06-14 MED ORDER — RIFAMPIN 300 MG PO CAPS: 300.0000 mg | ORAL_CAPSULE | Freq: Every day | ORAL | Status: AC

## 2012-06-14 MED ORDER — QUETIAPINE FUMARATE 50 MG PO TABS
50.0000 mg | ORAL_TABLET | Freq: Every day | ORAL | Status: DC
  Administered 2012-06-14: 50 mg via ORAL
  Filled 2012-06-14: qty 1

## 2012-06-14 MED ORDER — DOXYCYCLINE HYCLATE 100 MG PO CAPS: 100.0000 mg | ORAL_CAPSULE | Freq: Two times a day (BID) | ORAL | Status: AC

## 2012-06-14 MED ORDER — MELOXICAM 7.5 MG PO TABS: 7.5000 mg | ORAL_TABLET | Freq: Every day | ORAL | Status: DC

## 2012-06-14 NOTE — Plan of Care (Signed)
Problem: Phase I Progression Outcomes Goal: OOB as tolerated unless otherwise ordered Outcome: Progressing Recommend maximove.  Pt is not assisting with transfer

## 2012-06-14 NOTE — Progress Notes (Signed)
Patient very confused with hallucinations and anxious that his wife is not at bedside.  Patient currently is acting as though he is working in [employer], [employer]. Office.    RN talked with patient and was able to calm him down.  RN called his wife who he spoke with over the phone.  RN will continue to monitor the patient frequently.

## 2012-06-14 NOTE — Evaluation (Signed)
Occupational Therapy Evaluation Patient Details Name: Raymond Valdez MRN: [id number] DOB: 27-Feb-1929 Today's Date: 06/14/2012 Time: 6962-9528 OT Time Calculation (min): 17 min  OT Assessment / Plan / Recommendation Clinical Impression  This 78 year old man was admitted for lower extremity weakness.  He is a veteranian and infectious disease is looking into causes.  Pt has a h/o prostate CA and has macular degeneration.  He needs A x 2 for all mobility, unsupported sitting and LB ADLs.  he is will benefit from continued OT to decrease burden of care.      OT Assessment  Patient needs continued OT Services    Follow Up Recommendations  Skilled nursing facility    Barriers to Discharge      Equipment Recommendations  Defer to next venue    Recommendations for Other Services    Frequency  Min 1X/week    Precautions / Restrictions Precautions Precautions: Fall Restrictions Weight Bearing Restrictions: No   Pertinent Vitals/Pain No c/o pain    ADL  Eating/Feeding: Simulated;Independent Where Assessed - Eating/Feeding: Chair Grooming: Performed;Wash/dry hands;Wash/dry face;Set up Where Assessed - Grooming: Supported sitting Upper Body Bathing: Simulated;Minimal assistance Where Assessed - Upper Body Bathing: Supported sitting Lower Body Bathing: Simulated;+2 Total assistance Lower Body Bathing: Patient Percentage: 20% Where Assessed - Lower Body Bathing: Supine, head of bed flat;Rolling right and/or left;Supported sitting Upper Body Dressing: Simulated;Minimal assistance Where Assessed - Upper Body Dressing: Supported sitting Lower Body Dressing: Simulated;+2 Total assistance Lower Body Dressing: Patient Percentage: 0% Where Assessed - Lower Body Dressing: Rolling right and/or left Toilet Transfer: Simulated;+2 Total assistance Toilet Transfer: Patient Percentage: 0% Toilet Transfer Method: Squat pivot (pt had difficulty keeping feet on floor) Toilet Transfer Equipment:   (bed to chair) Toileting - Clothing Manipulation and Hygiene: Simulated;+2 Total assistance Toileting - Architect and Hygiene: Patient Percentage: 0% Where Assessed - Toileting Clothing Manipulation and Hygiene: Supine, head of bed flat;Rolling right and/or left Equipment Used: Gait belt Transfers/Ambulation Related to ADLs: leans R sitting EOB:  A x 2.  Unable to stand with A x 2    OT Diagnosis: Generalized weakness  OT Problem List: Decreased strength;Decreased activity tolerance;Impaired balance (sitting and/or standing);Impaired vision/perception;Decreased cognition;Decreased safety awareness;Decreased knowledge of use of DME or AE OT Treatment Interventions: Self-care/ADL training;Neuromuscular education;DME and/or AE instruction;Therapeutic activities;Patient/family education;Balance training;Visual/perceptual remediation/compensation;Cognitive remediation/compensation   OT Goals Acute Rehab OT Goals OT Goal Formulation: Patient unable to participate in goal setting Time For Goal Achievement: 06/28/12 Potential to Achieve Goals: Fair Miscellaneous OT Goals Miscellaneous OT Goal #1: Pt will sit unsupported EOB x 10 minutes with min A in prep for seated ADLS OT Goal: Miscellaneous Goal #1 - Progress: Goal set today Miscellaneous OT Goal #2: Pt will roll to bil sides with rail with mod A to assist with adls OT Goal: Miscellaneous Goal #2 - Progress: Goal set today Miscellaneous OT Goal #3: Pt will attend to and perform UB adls in supported sitting with supervision and min vcs OT Goal: Miscellaneous Goal #3 - Progress: Goal set today Miscellaneous OT Goal #4: Pt will maintain attention to task for 3 minutes with min cues in quiet environment OT Goal: Miscellaneous Goal #4 - Progress: Goal set today  Visit Information  Last OT Received On: 06/14/12 Assistance Needed: +2 PT/OT Co-Evaluation/Treatment: Yes Reason Eval/Treat Not Completed: Medical issues which prohibited  therapy    Subjective Data  Subjective: youd better ask mrs Apple Patient Stated Goal: unable to state   Prior Functioning  Vision/Perception  Home Living Lives With: Spouse Additional Comments: not oriented unable to answer questions Prior Function Comments: uncertain of prior functioning level. chart indicates decline in ability to abulate reported. Communication Communication: No difficulties (soft spoken) Dominant Hand: Left      Cognition  Overall Cognitive Status: Impaired Area of Impairment: Attention;Awareness of deficits Arousal/Alertness: Awake/alert Orientation Level: Disoriented to;Place;Time;Situation Behavior During Session: Flat affect Cognition - Other Comments: pt not oriented and talking out of context    Extremity/Trunk Assessment Right Upper Extremity Assessment RUE ROM/Strength/Tone: Deficits RUE ROM/Strength/Tone Deficits: strength grossly 3+/5 shoulder Left Upper Extremity Assessment LUE ROM/Strength/Tone: WFL for tasks assessed Right Lower Extremity Assessment RLE ROM/Strength/Tone: Deficits RLE ROM/Strength/Tone Deficits: noted tremors /sheking of estremeties when pt moves and attempts to stand. RLE Sensation: Deficits RLE Sensation Deficits: hyper sensitive to touch, jerks in response to touch Left Lower Extremity Assessment LLE ROM/Strength/Tone: Deficits LLE ROM/Strength/Tone Deficits: noted tromors/shking  when pt. moves and attempts to stand LLE Sensation: Deficits LLE Sensation Deficits: hypersensitive to touch. jerks in response to touch Trunk Assessment Trunk Assessment: Other exceptions Trunk Exceptions: head is tilted to right in bed and chair, leans to right in sitting.   Mobility Bed Mobility Bed Mobility: Supine to Sit;Sitting - Scoot to Edge of Bed Supine to Sit: 1: +2 Total assist;HOB elevated Supine to Sit: Patient Percentage: 20% Sitting - Scoot to Edge of Bed: 1: +2 Total assist Sitting - Scoot to Edge of Bed: Patient  Percentage: 0% Details for Bed Mobility Assistance: pt made a little effort to move legs to edge but generally held legs stiff and extended. pt did make effort to right trunk as moved pt into sitting position. Transfers Sit to Stand: 1: +2 Total assist;From bed Sit to Stand: Patient Percentage: 0% Stand to Sit: 1: +2 Total assist Stand to Sit: Patient Percentage: 0% Details for Transfer Assistance: pt was unable to stand from bed w/ 2 assisting. tended to pick up feet/not keep contact w/ floor. pt was pivoted to recliner w/ 2 assisting, pt tending to push backward., noted temors of legs, arms and runk w/ effort to move to recliner.   Exercise    Balance Static Sitting Balance Static Sitting - Balance Support: Bilateral upper extremity supported;Feet unsupported Static Sitting - Level of Assistance: 1: +2 Total assist;Other (comment) Static Sitting - Comment/# of Minutes: lists to R.  Head laterally tilted to R  End of Session OT - End of Session Activity Tolerance: Patient limited by fatigue Patient left: in chair;with call bell/phone within reach (maximove pad placed) Nurse Communication: Need for lift equipment  GO     Delight Bickle 06/14/2012, 11:43 AM Marica Otter, OTR/L [phone] 06/14/2012

## 2012-06-14 NOTE — Progress Notes (Signed)
INFECTIOUS DISEASE PROGRESS NOTE  ID: Raymond Valdez is a 78 y.o. male with   Principal Problem:  *Lower extremity weakness Active Problems:  History of prostate cancer  Infection, bartonella  Spinal stenosis  Peripheral neuropathy, chronic.  Encephalopathy acute, toxic and metabolic  C1 Mass  Subjective: Feels better, strength better.   Abtx:  Anti-infectives     Start     Dose/Rate Route Frequency Ordered Stop   06/13/12 2200   doxycycline (VIBRA-TABS) tablet 100 mg  Status:  Discontinued        100 mg Oral Every 12 hours 06/13/12 1628 06/13/12 1639   06/13/12 2200   doxycycline (VIBRAMYCIN) capsule 100 mg        100 mg Oral Every 12 hours 06/13/12 1641     06/13/12 1700   rifampin (RIFADIN) capsule 300 mg        300 mg Oral Daily 06/13/12 1628     06/12/12 1330   doxycycline (VIBRAMYCIN) capsule 100 mg  Status:  Discontinued        100 mg Oral Every 12 hours 06/12/12 1251 06/13/12 1201   06/12/12 1300   doxycycline (VIBRA-TABS) tablet 100 mg  Status:  Discontinued        100 mg Oral Every 12 hours 06/12/12 1228 06/12/12 1249   06/12/12 1230   rifampin (RIFADIN) capsule 300 mg  Status:  Discontinued        300 mg Oral Daily 06/12/12 1228 06/13/12 1201          Medications:  Scheduled:   . beta carotene w/minerals  1 tablet Oral Daily  . doxycycline  100 mg Oral Q12H  . meloxicam  7.5 mg Oral Daily  . predniSONE  50 mg Oral Q breakfast  . rifampin  300 mg Oral Daily  . sodium chloride  3 mL Intravenous Q12H  . DISCONTD: doxycycline  100 mg Oral Q12H    Objective: Vital signs in last 24 hours: Temp:  [97.3 F (36.3 C)-97.8 F (36.6 C)] 97.3 F (36.3 C) (07/19 0359) Pulse Rate:  [62-77] 77  (07/19 0359) Resp:  [16] 16  (07/19 0359) BP: (118-126)/(72-73) 126/73 mmHg (07/19 0359) SpO2:  [93 %-95 %] 93 % (07/19 0359)   General appearance: fatigued and slowed mentation Resp: clear to auscultation bilaterally Cardio: regular rate and rhythm GI: normal  findings: bowel sounds normal and soft, non-tender  Lab Results  Basename 06/14/12 0320 06/13/12 1120  WBC 11.9* --  HGB 13.8 --  HCT 40.9 --  NA 139 135  K 3.5 3.6  CL 103 99  CO2 25 25  BUN 20 16  CREATININE 0.62 0.65  GLU -- --   Liver Panel  Basename 06/13/12 1120  PROT 6.2  ALBUMIN 2.2*  AST 36  ALT 36  ALKPHOS 73  BILITOT 0.9  BILIDIR --  IBILI --   Sedimentation Rate  Basename 06/12/12 1230  ESRSEDRATE 66*   C-Reactive Protein No results found for this basename: CRP:2 in the last 72 hours  Microbiology: No results found for this or any previous visit (from the past 240 hour(s)).  Studies/Results: Mr Raymond Valdez Contrast  06/13/2012  *RADIOLOGY REPORT*  Clinical Data: Progressively worse lower extremity weakness. Recent infection.  MRI HEAD WITHOUT AND WITH CONTRAST  Technique:  Multiplanar, multiecho pulse sequences of the brain and surrounding structures were obtained according to standard protocol without and with intravenous contrast  Contrast: 16mL MULTIHANCE GADOBENATE DIMEGLUMINE 529 MG/ML IV SOLN  Comparison:  07/08/2009 MR brain.  06/12/2012 cervical spine MR.  Findings: Small amount of fluid in the C1-C2 articulation extending laterally.  No adjacent bony destruction, bone edema or enhancement to suggest infection.  Mild edema within the adjacent posterior paraspinal muscles/soft tissue greater on the left without drainable detected (edema is more conspicuous on the recent cervical spine MR).  It is possible these findings are related to degenerative changes.  As the patient had a recent infection, if there were progressive symptoms referable to this area, then this can be reevaluate on follow-up imaging to exclude infection.  No acute infarct.  No intracranial hemorrhage.  No intracranial enhancing lesion or findings to suggest meningitis.  Global atrophy without hydrocephalus.  Major intracranial vascular structures are patent.  Minimal to mild paranasal sinus  mucosal thickening most notable posterior right ethmoid sinus air cell.  IMPRESSION: C1-2 abnormality may represent degenerative changes as noted above.  No acute infarct.  No findings to suggest meningitis.  Global atrophy without hydrocephalus.  Original Report Authenticated By: Fuller Canada, M.D.   Mr Cervical Spine Wo Contrast  06/13/2012  *RADIOLOGY REPORT*  Clinical Data: Bilateral lower extremity weakness.  Recent bacterial infection.  History prostate cancer.  MRI CERVICAL SPINE WITHOUT CONTRAST  Technique:  Multiplanar and multiecho pulse sequences of the cervical spine, to include the craniocervical junction and cervicothoracic junction, were obtained according to standard protocol without intravenous contrast.  Comparison: 07/08/2009 MR brain.  No comparison cervical spine MR.  Findings: Cervical medullary junction unremarkable.  No focal cervical cord signal abnormality.  Fluid noted within the C1 lateral mass - C2 articulation.  Edema within the adjacent posterior soft tissues/paraspinal muscles are more notable on the left.  Given the patient's history of recent bacterial infection, infection of the joint space and surrounding soft tissue not excluded.  This can be re-evaluated with close follow-up if there are progressive symptoms.  The bony structures appear intact.  No other findings to suggest diskitis/osteomyelitis.  C2-3:  Minimal bulge greater to the left.  Minimal left foraminal narrowing. Right facet joint may be fused.  C3-4: Small spur greater to the left.  Mild left-sided cord contact.  Mild left foraminal narrowing.  C4-5:  Broad-based disc osteophyte complex greater to the left. Minimal left-sided cord flattening.  Uncinate spurring.  Mild to slightly moderate bilateral foraminal narrowing.  C5-6:  Small broad-based disc osteophyte greater to the left. Minimal left-sided cord flattening.  Uncinate spurring.  Moderate bilateral foraminal narrowing.  C6-7:  Small broad-based disc  osteophyte greater to the right.  No significant cord flattening.  Uncinate spurring.  Moderate bilateral foraminal narrowing.  C7-T1:  Minimal bulge.  IMPRESSION:  Fluid noted within the C1 lateral mass - C2 articulation.  Edema within the adjacent posterior soft tissues/paraspinal muscles are more notable on the left.  Given the patient's history of recent bacterial infection, infection of the joint space and surrounding soft tissue not excluded.  Mild to slightly moderate degenerative changes most notable C4-5 through C6-7 as detailed above.  This has been made a PRA call report utilizing dashboard call feature.  Original Report Authenticated By: Fuller Canada, M.D.     Assessment/Plan B vinsonii  Weakness Day 3 doxy/rif-  His neck is more mobile today.  Appreciate neurosurgery eval, IR eval.  Appreciate Dr York Ram help in trying to get pt eval at [facility] Delton Coombes MD).     [name] Infectious Diseases [phone] 06/14/2012, 1:42 PM   LOS: 4 days

## 2012-06-14 NOTE — Evaluation (Signed)
Physical Therapy Evaluation Patient Details Name: Raymond Valdez MRN: [id number] DOB: 07-28-1929 Today's Date: 06/14/2012 Time: 4098-1191 PT Time Calculation (min): 15 min  PT Assessment / Plan / Recommendation Clinical Impression  pt admitted w/ LE weakness, inability to ambulate. pt currently is unable to stand, has sitting balance issues. No family present for information regarding prior level of function and DC plan. Presently recommend SNF. Pt. will benefit from  PT to improve safety and functional mobility while in Hospital.    PT Assessment  Patient needs continued PT services    Follow Up Recommendations  Skilled nursing facility    Barriers to Discharge        Equipment Recommendations  Defer to next venue    Recommendations for Other Services OT consult   Frequency Min 3X/week    Precautions / Restrictions Precautions Precautions: Fall Restrictions Weight Bearing Restrictions: No   Pertinent Vitals/Pain No c/o      Mobility  Bed Mobility Bed Mobility: Supine to Sit;Sitting - Scoot to Edge of Bed Supine to Sit: 1: +2 Total assist;HOB elevated Supine to Sit: Patient Percentage: 20% Sitting - Scoot to Edge of Bed: 1: +2 Total assist Sitting - Scoot to Edge of Bed: Patient Percentage: 0% Details for Bed Mobility Assistance: pt made a little effort to move legs to edge but generally held legs stiff and extended. pt did make effort to right trunk as moved pt into sitting position. Transfers Transfers: Sit to Stand;Stand to Sit;Squat Pivot Transfers Sit to Stand: 1: +2 Total assist;From bed Sit to Stand: Patient Percentage: 0% Stand to Sit: 1: +2 Total assist Stand to Sit: Patient Percentage: 0% Squat Pivot Transfers: 1: +2 Total assist Squat Pivot Transfers: Patient Percentage: 0% Details for Transfer Assistance: pt was unable to stand from bed w/ 2 assisting. tended to pick up feet/not keep contact w/ floor. pt was pivoted to recliner w/ 2 assisting, pt tending  to push backward., noted temors of legs, arms and runk w/ effort to move to recliner.    Exercises     PT Diagnosis: Difficulty walking;Generalized weakness  PT Problem List: Decreased cognition;Decreased strength;Decreased range of motion;Decreased activity tolerance;Decreased balance;Decreased mobility;Decreased safety awareness;Impaired tone PT Treatment Interventions: DME instruction;Gait training;Functional mobility training;Therapeutic activities;Balance training;Patient/family education   PT Goals Acute Rehab PT Goals PT Goal Formulation: Patient unable to participate in goal setting Time For Goal Achievement: 06/28/12 Potential to Achieve Goals: Fair Pt will go Supine/Side to Sit: with mod assist PT Goal: Supine/Side to Sit - Progress: Goal set today Pt will go Sit to Supine/Side: with mod assist PT Goal: Sit to Supine/Side - Progress: Goal set today Pt will go Sit to Stand: with +2 total assist;Other (comment) (pt.= 50%) PT Goal: Sit to Stand - Progress: Goal set today Pt will go Stand to Sit: with mod assist PT Goal: Stand to Sit - Progress: Goal set today Pt will Transfer Bed to Chair/Chair to Bed: with +2 total assist (pt= 50%) PT Transfer Goal: Bed to Chair/Chair to Bed - Progress: Goal set today Pt will Ambulate: 1 - 15 feet;with +2 total assist;with rolling walker;Other (comment) (pt=50%) PT Goal: Ambulate - Progress: Goal set today  Visit Information  Last PT Received On: 06/14/12 Assistance Needed: +2 (safety)    Subjective Data  Subjective: I am right here. Patient Stated Goal: pt unable   Prior Functioning  Home Living Lives With: Spouse Additional Comments: not oriented unable to answer questions Prior Function Comments: uncertain of prior functioning level.  chart indicates decline in ability to abulate reported.    Cognition  Overall Cognitive Status: Impaired Area of Impairment: Attention;Awareness of deficits Arousal/Alertness:  Awake/alert Orientation Level: Disoriented to;Place;Time;Situation Behavior During Session: Flat affect    Extremity/Trunk Assessment Right Lower Extremity Assessment RLE ROM/Strength/Tone: Deficits RLE ROM/Strength/Tone Deficits: noted tremors /sheking of estremeties when pt moves and attempts to stand. RLE Sensation: Deficits RLE Sensation Deficits: hyper sensitive to touch, jerks in response to touch Left Lower Extremity Assessment LLE ROM/Strength/Tone: Deficits LLE ROM/Strength/Tone Deficits: noted tromors/shking  when pt. moves and attempts to stand LLE Sensation: Deficits LLE Sensation Deficits: hypersensitive to touch. jerks in response to touch Trunk Assessment Trunk Assessment: Other exceptions Trunk Exceptions: head is tilted to right in bed and chair, leans to right in sitting.   Balance Static Sitting Balance Static Sitting - Balance Support: Bilateral upper extremity supported;Feet unsupported Static Sitting - Level of Assistance: 1: +2 Total assist;Other (comment) (pt=30%, listing to Right.)  End of Session PT - End of Session Activity Tolerance: Other (comment) (limited by decreased ability to move self) Patient left: in chair;with call bell/phone within reach Nurse Communication: Need for lift equipment;Mobility status  GP     Rada Hay 06/14/2012, 10:54 AM  [phone]

## 2012-06-14 NOTE — Progress Notes (Signed)
Called Greggory Stallion RN at [facility] for report of [employer].  Contact number given to RN if any further questions once pt. Is received.  Notified wife Myriam Jacobson about transfer to [facility] will call wife when pt. Arrives.

## 2012-06-14 NOTE — Discharge Summary (Addendum)
Physician Discharge Summary  Raymond Valdez:[id number] DOB: May 08, 1929 DOA: 06/10/2012  PCP: Redmond Baseman, MD  Admit date: 06/10/2012 Discharge date: 06/14/2012  Recommendations for Outpatient Follow-up:  1. F/U with [facility] recommendations.  Patient being transferred to [facility] for further evaluation.  Discharge Diagnoses:   Principal Problem:  *Lower extremity weakness in a patient with a history of chronic peripheral neuropathy and mild lumbar spinal stenosis as well as Bartonella infection  Active Problems:   History of prostate cancer   Infection, bartonella   Spinal stenosis   Peripheral neuropathy, chronic.   Encephalopathy acute, toxic and metabolic   C1 Mass   Discharge Condition: Stable.  Diet recommendation: Regular.  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 His most recent cycle of doxycycline was finished in May. He was admitted on 06/10/12 with complaints of 2 weeks progressive decrease in strength BLE.    Hospital Course by problem:  Principal Problem:  *Lower extremity weakness in a patient with a history of chronic peripheral neuropathy and mild lumbar spinal stenosis as well as Bartonella infection  Etiology unclear but likely includes peripheral neuropathy, lumbar spinal stenosis or Bartonella infection, or some combination thereof. Also C-spine MRI shows a fluid filled C1 mass of uncertain significance, but likely related to an effusion/degenerative process.  ANA negative; ESR 66; Total CK: 118; B12 248. B1 12; Folate 363; Vitamin D 33.  Cervical spine MRI results as noted below.  Nerve conduction tests ordered, not able to be done in the hospital. This would rule out CIDP, but  can only be done in outpatient setting.  Evaluated by neurologist on 06/10/12: No indication for LP as his presentation is not felt to be consistent with GBS.  MRI of brain unrevealing.  PT/OT evaluation ordered, evaluation pending.  Steroids started empirically on 06/12/12.  Transfer to [facility] with inpatient ability to perform EMG studies and consultation with ID knowledgeable in treating Bartonella infection. Active Problems:  C1 mass  Unclear etiology, ? Infectious  ID recommended neurosurgical evaluation. Spoke with Dr. Phoebe Perch (neurosurgeon) who did not feel there was any neurosurgical indication, or that his symptoms could be explained by the abnormality seen on MRI.  Asked IR to review films to see if they could aspirate the fluid collection and send for culture, but it was felt that the fluid collection was too small, and the area too vascular to safely proceed. F/U MRI demonstrated that "mass" was likely due to degenerative changes at C1-2. Encephalopathy acute, toxic and metabolic  AMS with hallucinations noted by nursing staff overnight 06/12/12, and persists.  Not likely to be from prednisone, since symptoms began before 1st dose given.  Rifampin can cause psychosis.  TSH 2.519, RPR non-reactive. Started Seroquel Q HS to help clear muddied sensorium. History of prostate cancer  Lumbar spine MRI unremarkable except for mild spinal stenosis. Infection, bartonella  Evaluated by Dr. Ninetta Lights 06/10/12. No empiric antibiotics initially recommended, but Dr. Ninetta Lights ultimately recommended a round of doxycycline/rifampin. Last round of doxycycline given in May.  Dr. Ninetta Lights contacted specialist at [facility], repeat labs pending at [facility] lab.  Continue doxycycline/rifampin.  F/U with [facility] at d/c per ID recs. Spinal stenosis  Only mild spinal stenosis noted on lumbar spine MRI.  Cervical spine  MRI does not show spinal stenosis.  Procedures:  None.  Consultants:  Dr. Noel Christmas,  Neurology  Dr. [name], ID  Dr. Phoebe Perch, Neurosurgery  Discharge Exam: Filed Vitals:   06/14/12 1446  BP: 135/79  Pulse: 73  Temp: 97.9 F (36.6 C)  Resp: 18   Filed Vitals:   06/13/12 1520 06/13/12 2210 06/14/12 0359 06/14/12 1446  BP: 118/72  126/73 135/79  Pulse: 73 62 77 73  Temp: 97.8 F (36.6 C) 97.8 F (36.6 C) 97.3 F (36.3 C) 97.9 F (36.6 C)  TempSrc: Oral Oral Oral Oral  Resp: 16 16 16 18   Height:      Weight:      SpO2: 95% 95% 93% 96%   Gen: NAD  Cardiovascular: RRR, No M/R/G  Respiratory: Lungs CTAB  Gastrointestinal: Abdomen soft, NT/ND with normal active bowel sounds.  Extremities: No C/E/C  Neuro: + generalized weakness, pronator drift. LE weaker than UE.     Discharge Instructions  Discharge Orders    Future Orders Please Complete By Expires   Diet general      Increase activity slowly        Medication List  As of 06/14/2012  6:13 PM   STOP taking these medications         furosemide 20 MG tablet      KLOR-CON M20 20 MEQ tablet         TAKE these medications         beta carotene w/minerals tablet   Take 1 tablet by mouth daily.      doxycycline 100 MG capsule   Commonly known as: VIBRAMYCIN   Take 1 capsule (100 mg total) by mouth every 12 (twelve) hours.      FISH OIL PO   Take 1 tablet by mouth daily.      meloxicam 7.5 MG tablet   Commonly known as: MOBIC   Take 1 tablet (7.5 mg total) by mouth daily.      polyvinyl alcohol 1.4 % ophthalmic solution   Commonly known as: LIQUIFILM TEARS   Place 1 drop into both eyes as needed.      predniSONE 50 MG tablet   Commonly known as: DELTASONE   Taper by 10 mg daily until off.      QUEtiapine 50 MG tablet   Commonly known as: SEROQUEL   Take 1 tablet (50 mg total) by mouth at bedtime.      rifampin 300 MG capsule   Commonly known as: RIFADIN   Take 1 capsule (300 mg total) by mouth daily.           Follow-up Information    Schedule an appointment as soon as  possible for a visit with Evie Lacks, MD. (As needed)    Contact information:   [address] [place] [address] [phone]       Schedule an appointment as soon as possible for a visit with Redmond Baseman, MD. (As needed)    Contact information:   [address] [place] [address] [phone]           The results of significant diagnostics from this hospitalization (including imaging, microbiology, ancillary and laboratory) are listed below for reference.    Significant Diagnostic Studies: Dg Chest 2 View 06/10/2012 IMPRESSION: Calcified lymph nodes more notable right hilar region suggestive of prior granulomas exposure. Left base subsegmental atelectasis/scarring suspected. No infiltrate, congestive heart  failure or pneumothorax. Calcified tortuous aorta. Original Report Authenticated By: Fuller Canada, M.D.  Ct Head W Wo Contrast 06/10/2012 IMPRESSION: No acute intracranial abnormality. Diffuse atrophy. No pathologic enhancement after contrast administration. Original Report Authenticated By: Gwynn Burly, M.D.  Mr Lumbar Spine Wo Contrast 06/11/2012 IMPRESSION: Negative for fracture. Negative for critical spinal stenosis. Lumbar degenerative changes are present as described above. There is right foraminal narrowing at L3-4. Mild spinal stenosis at L4- 5. Foraminal stenosis at L5-S1, left greater than right. Original Report Authenticated By: Camelia Phenes, M.D.  Mr Cervical Spine Wo Contrast 06/13/2012 IMPRESSION: Fluid noted within the C1 lateral mass - C2 articulation. Edema within the adjacent posterior soft tissues/paraspinal muscles are more notable on the left. Given the patient's history of recent bacterial infection, infection of the joint space and surrounding soft tissue not excluded. Mild to slightly moderate degenerative changes most notable C4-5 through C6-7 as detailed above. This has been  made a PRA call report utilizing dashboard call feature. Original Report Authenticated By: Fuller Canada, M.D.  Mr Laqueta Jean Wo Contrast 06/13/2012 IMPRESSION: C1-2 abnormality may represent degenerative changes as noted above. No acute infarct. No findings to suggest meningitis. Global atrophy without hydrocephalus. Original Report Authenticated By: Fuller Canada, M.D.   Microbiology: No results found for this or any previous visit (from the past 240 hour(s)).   Labs: Basic Metabolic Panel:  Lab 06/14/12 1610 06/13/12 1120 06/11/12 0330 06/10/12 1145  NA 139 135 138 139  K 3.5 3.6 3.6 4.3  CL 103 99 103 103  CO2 25 25 27 27   GLUCOSE 104* 189* 109* 101*  BUN 20 16 22  25*  CREATININE 0.62 0.65 0.82 0.73  CALCIUM 8.8 8.5 8.5 9.0  MG -- -- -- --  PHOS -- -- -- --   Liver Function Tests:  Lab 06/13/12 1120 06/10/12 1145  AST 36 70*  ALT 36 29  ALKPHOS 73 42  BILITOT 0.9 1.3*  PROT 6.2 6.7  ALBUMIN 2.2* 2.8*   CBC:  Lab 06/14/12 0320 06/11/12 0330 06/10/12 1145  WBC 11.9* 11.3* 13.4*  NEUTROABS -- -- 10.6*  HGB 13.8 11.9* 14.1  HCT 40.9 36.5* 40.9  MCV 90.5 91.9 91.3  PLT 194 128* 140*   Cardiac Enzymes:  Lab 06/12/12 1230  CKTOTAL 118  CKMB --  CKMBINDEX --  TROPONINI --   Time coordinating discharge: 45 minutes.  Signed:  Kemari Narez  [facility] 06/14/2012, 6:13 PM

## 2012-06-14 NOTE — Progress Notes (Signed)
[facility] PROGRESS NOTE  Raymond Valdez ZOX:[id number] DOB: 08-01-1929 DOA: 06/10/2012 PCP: Redmond Baseman, MD  Assessment/Plan: Principal Problem:  *Lower extremity weakness in a patient with a history of chronic peripheral neuropathy and mild lumbar spinal stenosis as well as Bartonella infection  Etiology unclear but likely includes peripheral neuropathy, lumbar spinal stenosis or Bartonella infection, or some combination thereof.  Also C-spine MRI shows a fluid filled C1 mass of uncertain significance, but likely related to an effusion/degenerative process.  ANA negative; ESR 66; Total CK: 118; B12 248. B1 12; Folate 363; Vitamin D 33.  Cervical spine MRI results as noted below.  Nerve conduction tests ordered, not able to be done in the hospital.  This would rule out CIDP, but can only be done in outpatient setting.  Evaluated by neurologist on 06/10/12: No indication for LP as his presentation is not felt to be consistent with GBS.  MRI of brain unrevealing.  PT/OT evaluation ordered, evaluation pending.  Steroids started empirically on 06/12/12.    ? Transfer to a tertiary care center with inpatient ability to perform EMG studies. Active Problems:  C1 mass  Unclear etiology, ? Infectious  ID recommends neurosurgical evaluation.  Spoke with Dr. Phoebe Perch who did not feel there was any neurosurgical indication, or that his symptoms could be explained by the abnormality seen on MRI.  Asked IR to review films to see if they can aspirate the fluid collection and send for culture, but it was felt that the fluid collection was too small, and the area too vascular to safely proceed.  Encephalopathy acute, toxic and metabolic  AMS with hallucinations noted by nursing staff overnight 06/12/12, and persists.  Not likely to be from prednisone, since symptoms began before 1st dose given.  Rifampin can cause psychosis.  TSH 2.519, RPR non-reactive.  History of prostate  cancer  Lumbar spine MRI unremarkable except for mild spinal stenosis.  Infection, bartonella  Evaluated by Dr. Ninetta Lights 06/10/12.  No empiric antibiotics initially recommended, but Dr. Ninetta Lights ultimately recommended a round of doxycycline/rifampin.  Last round of doxycycline given in May.  Dr. Ninetta Lights contacted specialist at [facility], repeat labs pending at [facility] lab.  Continue doxycycline/rifampin.  F/U with [facility] at d/c per ID recs.  Spinal stenosis  Only mild spinal stenosis noted on lumbar spine MRI.  Cervical spine MRI does not show spinal stenosis.   Code Status: Full. Family Communication: Spoke with wife and daughter at bedside.  Disposition Plan: Pending clinical improvement and further recommendations from specialist involved.    Brief narrative: 78 y/o CM with h/o prostate cancer s/p prostate surgery (which has left patient incontinent), with history of spinal stenosis, and prior infection with Bartonella vinsonii ~ 2 years ago of which he has had treatment with antibiotics on and off with doxycycline reportedly. His most recent cycle of doxycycline was finished in May. He was admitted on 06/10/12 with complaints of 2 weeks progressive decrease in strength BLE.   Consultants:  Dr. Noel Christmas, Neurology  Dr. [name], ID  Dr. Phoebe Perch, Neurosurgery  Procedures:  None.  Antibiotics: Anti-infectives     Start     Dose/Rate Route Frequency Ordered Stop   06/13/12 2200   doxycycline (VIBRA-TABS) tablet 100 mg  Status:  Discontinued        100 mg Oral Every 12 hours 06/13/12 1628 06/13/12 1639   06/13/12 2200   doxycycline (VIBRAMYCIN) capsule 100 mg        100 mg Oral Every 12  hours 06/13/12 1641     06/13/12 1700   rifampin (RIFADIN) capsule 300 mg        300 mg Oral Daily 06/13/12 1628     06/12/12 1330   doxycycline (VIBRAMYCIN) capsule 100 mg  Status:  Discontinued        100 mg Oral Every 12 hours 06/12/12 1251 06/13/12 1201   06/12/12 1300    doxycycline (VIBRA-TABS) tablet 100 mg  Status:  Discontinued        100 mg Oral Every 12 hours 06/12/12 1228 06/12/12 1249   06/12/12 1230   rifampin (RIFADIN) capsule 300 mg  Status:  Discontinued        300 mg Oral Daily 06/12/12 1228 06/13/12 1201         HPI/Subjective: Raymond Valdez continues to be weak, but is currently sitting up in the chair.  He has had ongoing delirium and hallucinations.  No significant pain, nausea or vomiting.  Objective: Filed Vitals:   06/13/12 0501 06/13/12 1520 06/13/12 2210 06/14/12 0359  BP: 144/82 118/72  126/73  Pulse: 76 73 62 77  Temp: 98.8 F (37.1 C) 97.8 F (36.6 C) 97.8 F (36.6 C) 97.3 F (36.3 C)  TempSrc: Oral Oral Oral Oral  Resp: 16 16 16 16   Height:      Weight:      SpO2: 94% 95% 95% 93%    Intake/Output Summary (Last 24 hours) at 06/14/12 1149 Last data filed at 06/14/12 0400  Gross per 24 hour  Intake      0 ml  Output   1101 ml  Net  -1101 ml    Exam: Gen:  NAD Cardiovascular:  RRR, No M/R/G Respiratory: Lungs CTAB Gastrointestinal: Abdomen soft, NT/ND with normal active bowel sounds. Extremities: No C/E/C Neuro: + generalized weakness, pronator drift.  LE weaker than UE.    Data Reviewed: Basic Metabolic Panel:  Lab 06/14/12 1610 06/13/12 1120 06/11/12 0330 06/10/12 1145  NA 139 135 138 139  K 3.5 3.6 -- --  CL 103 99 103 103  CO2 25 25 27 27   GLUCOSE 104* 189* 109* 101*  BUN 20 16 22  25*  CREATININE 0.62 0.65 0.82 0.73  CALCIUM 8.8 8.5 8.5 9.0  MG -- -- -- --  PHOS -- -- -- --   GFR Estimated Creatinine Clearance: 70.1 ml/min (by C-G formula based on Cr of 0.62). Liver Function Tests:  Lab 06/13/12 1120 06/10/12 1145  AST 36 70*  ALT 36 29  ALKPHOS 73 42  BILITOT 0.9 1.3*  PROT 6.2 6.7  ALBUMIN 2.2* 2.8*   Coagulation profile  Lab 06/10/12 1145  INR 1.22  PROTIME --    CBC:  Lab 06/14/12 0320 06/11/12 0330 06/10/12 1145  WBC 11.9* 11.3* 13.4*  NEUTROABS -- -- 10.6*  HGB 13.8  11.9* 14.1  HCT 40.9 36.5* 40.9  MCV 90.5 91.9 91.3  PLT 194 128* 140*    Studies:  Dg Chest 2 View 06/10/2012 IMPRESSION: Calcified lymph nodes more notable right hilar region suggestive of prior granulomas exposure.  Left base subsegmental atelectasis/scarring suspected.  No infiltrate, congestive heart failure or pneumothorax.  Calcified tortuous aorta.  Original Report Authenticated By: Fuller Canada, M.D.    Ct Head W Wo Contrast 06/10/2012 IMPRESSION: No acute intracranial abnormality.  Diffuse atrophy.  No pathologic enhancement after contrast administration.  Original Report Authenticated By: Gwynn Burly, M.D.    Mr Lumbar Spine Wo Contrast 06/11/2012 IMPRESSION: Negative for fracture.  Negative for critical spinal stenosis.  Lumbar degenerative changes are present as described above.  There is right foraminal narrowing at L3-4.  Mild spinal stenosis at L4- 5.  Foraminal stenosis at L5-S1, left greater than right.  Original Report Authenticated By: Camelia Phenes, M.D.    Mr Cervical Spine Wo Contrast 06/13/2012  IMPRESSION:  Fluid noted within the C1 lateral mass - C2 articulation.  Edema within the adjacent posterior soft tissues/paraspinal muscles are more notable on the left.  Given the patient's history of recent bacterial infection, infection of the joint space and surrounding soft tissue not excluded.  Mild to slightly moderate degenerative changes most notable C4-5 through C6-7 as detailed above.  This has been made a PRA call report utilizing dashboard call feature.  Original Report Authenticated By: Fuller Canada, M.D.    Mr Laqueta Jean Wo Contrast 06/13/2012 IMPRESSION: C1-2 abnormality may represent degenerative changes as noted above.  No acute infarct.  No findings to suggest meningitis.  Global atrophy without hydrocephalus.  Original Report Authenticated By: Fuller Canada, M.D.    Scheduled Meds:    . beta carotene w/minerals  1 tablet Oral Daily  . doxycycline   100 mg Oral Q12H  . meloxicam  7.5 mg Oral Daily  . predniSONE  50 mg Oral Q breakfast  . rifampin  300 mg Oral Daily  . sodium chloride  3 mL Intravenous Q12H  . DISCONTD: doxycycline  100 mg Oral Q12H  . DISCONTD: doxycycline  100 mg Oral Q12H  . DISCONTD: rifampin  300 mg Oral Daily   Continuous Infusions:   Time spent: 40 minutes   LOS: 4 days   See [place]  [facility] Pager [phone].  If 8PM-8AM, please contact night-coverage at [email or web address], password [facility] 06/14/2012, 11:49 AM  LOS: 4 days

## 2012-06-14 NOTE — Progress Notes (Deleted)
OT Note:  Reviewed chart.  Noted neurosurgery has been consulted.  Will check back.  [place], OTR/L [phone] 06/14/2012

## 2012-06-14 NOTE — Progress Notes (Signed)
[facility] PROGRESS NOTE    SUBJECTIVE   Patient currently in room, has no complaints of pain.  He is actively hallucinating and talking to three different people while I am in the room.    OBJECTIVE   Vital signs in last 24 hours: Temp:  [97.3 F (36.3 C)-97.8 F (36.6 C)] 97.3 F (36.3 C) (07/19 0359) Pulse Rate:  [62-77] 77  (07/19 0359) Resp:  [16] 16  (07/19 0359) BP: (118-126)/(72-73) 126/73 mmHg (07/19 0359) SpO2:  [93 %-95 %] 93 % (07/19 0359)  Intake/Output from previous day: 07/18 0701 - 07/19 0700 In: 0  Out: 1101 [Urine:1100; Stool:1] Intake/Output this shift:   Nutritional status: Cardiac  Past Medical History  Diagnosis Date  . Macular degeneration   . History of prostate cancer   . Infection, bartonella   . Spinal stenosis     Neurologic Exam:   Mental Status:  Alert, oriented to WL, year and month. He is currently hallucinating and see other people in the room. Speech fluent without evidence of aphasia. Able to follow 3 step commands without difficulty.    Cranial Nerves:  II-Visual fields grossly intact.  III/IV/VI-Extraocular movements intact. Pupils reactive bilaterally. Ptosis not present.  V/VII-Smile symmetric  VIII-grossly intact  IX/X-normal gag  XI-bilateral shoulder shrug  XII-midline tongue extension  --He continues to hold his neck to the right (less so today) with increased ROM of neck.  He is not complaining of neck pain but I am not able to passively move his neck to the left or right.  Motor: He is able to lift arms off the bed and hold antigravity without drift. Proximal shoulder strength is 4/5 and distal bicep/tricep strength is 4/5. Grip bilaterally weak. Hip flexors show 4/5 strength with difficulty flexing beyond 45 degrees, knee flexion and extension 4/5 and DF/PF 5/5. His tone is increased throughout, positive cog wheeling, positive palmomental. Sensory: Pinprick and light  touch intact throughout, bilaterally  Deep Tendon Reflexes: 2+ and symmetric throughout including achilles.  Plantars: Right: upgoing Left: Downgoing  Cerebellar: finger-to-nose shows limited ROM but no dysmetria noted.   Lab Results: Results for orders placed during the hospital encounter of 06/10/12 (from the past 24 hour(s))  COMPREHENSIVE METABOLIC PANEL     Status: Abnormal   Collection Time   06/13/12 11:20 AM      Component Value Range   Sodium 135  135 - 145 mEq/L   Potassium 3.6  3.5 - 5.1 mEq/L   Chloride 99  96 - 112 mEq/L   CO2 25  19 - 32 mEq/L   Glucose, Bld 189 (*) 70 - 99 mg/dL   BUN 16  6 - 23 mg/dL   Creatinine, Ser 1.19  0.50 - 1.35 mg/dL   Calcium 8.5  8.4 - 14.7 mg/dL   Total Protein 6.2  6.0 - 8.3 g/dL   Albumin 2.2 (*) 3.5 - 5.2 g/dL   AST 36  0 - 37 U/L   ALT 36  0 - 53 U/L   Alkaline Phosphatase 73  39 - 117 U/L   Total Bilirubin 0.9  0.3 - 1.2 mg/dL   GFR calc non Af Amer 89 (*) >90 mL/min   GFR calc Af Amer >90  >90 mL/min  RPR  Status: Normal   Collection Time   06/13/12 11:20 AM      Component Value Range   RPR NON REACTIVE  NON REACTIVE  TSH     Status: Normal   Collection Time   06/13/12 11:20 AM      Component Value Range   TSH 2.519  0.350 - 4.500 uIU/mL  BASIC METABOLIC PANEL     Status: Abnormal   Collection Time   06/14/12  3:20 AM      Component Value Range   Sodium 139  135 - 145 mEq/L   Potassium 3.5  3.5 - 5.1 mEq/L   Chloride 103  96 - 112 mEq/L   CO2 25  19 - 32 mEq/L   Glucose, Bld 104 (*) 70 - 99 mg/dL   BUN 20  6 - 23 mg/dL   Creatinine, Ser 1.61  0.50 - 1.35 mg/dL   Calcium 8.8  8.4 - 09.6 mg/dL   GFR calc non Af Amer >90  >90 mL/min   GFR calc Af Amer >90  >90 mL/min  CBC     Status: Abnormal   Collection Time   06/14/12  3:20 AM      Component Value Range   WBC 11.9 (*) 4.0 - 10.5 K/uL   RBC 4.52  4.22 - 5.81 MIL/uL   Hemoglobin 13.8  13.0 - 17.0 g/dL   HCT 04.5  40.9 - 81.1 %   MCV 90.5  78.0 - 100.0 fL   MCH  30.5  26.0 - 34.0 pg   MCHC 33.7  30.0 - 36.0 g/dL   RDW 91.4  78.2 - 95.6 %   Platelets 194  150 - 400 K/uL   Lipid Panel No results found for this basename: CHOL,TRIG,HDL,CHOLHDL,VLDL,LDLCALC in the last 72 hours  Studies/Results: Mr Laqueta Jean Wo Contrast  06/13/2012  *RADIOLOGY REPORT*  Clinical Data: Progressively worse lower extremity weakness. Recent infection.  MRI HEAD WITHOUT AND WITH CONTRAST  Technique:  Multiplanar, multiecho pulse sequences of the brain and surrounding structures were obtained according to standard protocol without and with intravenous contrast  Contrast: 16mL MULTIHANCE GADOBENATE DIMEGLUMINE 529 MG/ML IV SOLN  Comparison: 07/08/2009 MR brain.  06/12/2012 cervical spine MR.  Findings: Small amount of fluid in the C1-C2 articulation extending laterally.  No adjacent bony destruction, bone edema or enhancement to suggest infection.  Mild edema within the adjacent posterior paraspinal muscles/soft tissue greater on the left without drainable detected (edema is more conspicuous on the recent cervical spine MR).  It is possible these findings are related to degenerative changes.  As the patient had a recent infection, if there were progressive symptoms referable to this area, then this can be reevaluate on follow-up imaging to exclude infection.  No acute infarct.  No intracranial hemorrhage.  No intracranial enhancing lesion or findings to suggest meningitis.  Global atrophy without hydrocephalus.  Major intracranial vascular structures are patent.  Minimal to mild paranasal sinus mucosal thickening most notable posterior right ethmoid sinus air cell.  IMPRESSION: C1-2 abnormality may represent degenerative changes as noted above.  No acute infarct.  No findings to suggest meningitis.  Global atrophy without hydrocephalus.  Original Report Authenticated By: Fuller Canada, M.D.   Mr Cervical Spine Wo Contrast  06/13/2012  *RADIOLOGY REPORT*  Clinical Data: Bilateral lower  extremity weakness.  Recent bacterial infection.  History prostate cancer.  MRI CERVICAL SPINE WITHOUT CONTRAST  Technique:  Multiplanar and multiecho pulse sequences of the cervical spine, to include  the craniocervical junction and cervicothoracic junction, were obtained according to standard protocol without intravenous contrast.  Comparison: 07/08/2009 MR brain.  No comparison cervical spine MR.  Findings: Cervical medullary junction unremarkable.  No focal cervical cord signal abnormality.  Fluid noted within the C1 lateral mass - C2 articulation.  Edema within the adjacent posterior soft tissues/paraspinal muscles are more notable on the left.  Given the patient's history of recent bacterial infection, infection of the joint space and surrounding soft tissue not excluded.  This can be re-evaluated with close follow-up if there are progressive symptoms.  The bony structures appear intact.  No other findings to suggest diskitis/osteomyelitis.  C2-3:  Minimal bulge greater to the left.  Minimal left foraminal narrowing. Right facet joint may be fused.  C3-4: Small spur greater to the left.  Mild left-sided cord contact.  Mild left foraminal narrowing.  C4-5:  Broad-based disc osteophyte complex greater to the left. Minimal left-sided cord flattening.  Uncinate spurring.  Mild to slightly moderate bilateral foraminal narrowing.  C5-6:  Small broad-based disc osteophyte greater to the left. Minimal left-sided cord flattening.  Uncinate spurring.  Moderate bilateral foraminal narrowing.  C6-7:  Small broad-based disc osteophyte greater to the right.  No significant cord flattening.  Uncinate spurring.  Moderate bilateral foraminal narrowing.  C7-T1:  Minimal bulge.  IMPRESSION:  Fluid noted within the C1 lateral mass - C2 articulation.  Edema within the adjacent posterior soft tissues/paraspinal muscles are more notable on the left.  Given the patient's history of recent bacterial infection, infection of the joint  space and surrounding soft tissue not excluded.  Mild to slightly moderate degenerative changes most notable C4-5 through C6-7 as detailed above.  This has been made a PRA call report utilizing dashboard call feature.  Original Report Authenticated By: Fuller Canada, M.D.    Medications:     Scheduled:   . beta carotene w/minerals  1 tablet Oral Daily  . doxycycline  100 mg Oral Q12H  . meloxicam  7.5 mg Oral Daily  . predniSONE  50 mg Oral Q breakfast  . rifampin  300 mg Oral Daily  . sodium chloride  3 mL Intravenous Q12H  . DISCONTD: doxycycline  100 mg Oral Q12H  . DISCONTD: doxycycline  100 mg Oral Q12H  . DISCONTD: predniSONE  60 mg Oral Q breakfast  . DISCONTD: rifampin  300 mg Oral Daily    Assessment/Plan:   Patient Active Hospital Problem List: Lower extremity weakness (06/10/2012) Assessment: Etiology unclear at this time. MRI C-Spine shows no significant canal stenosis and MRI L-Spine shows no focal critical spinal stenosis MRI brain shows no intracranial mass or infarct. Patients DTR remain 2+ through out on exam and his strength is 4/5 through out with weakness noted to be greater in proximal muscles than distal. He does tend to hold his head turned to the right with increased tone noted in left SCM which seems to have improved slightly. He has remained A-fbrile with slight elevated WBC while in the hospital.   At this time CIDP remains in the differential given his proximal weakness in addition to peripheral neuropathy.   Plan:  Patient should be followed up in out patient neurological setting for EMG/NCV and constellation of tremor, shuffled gait and cogwheel rigidity.   Neurology will sign off at this time.  Feel free to call with questions.      Braedin Millhouse PA-C [facility] [phone]  06/14/2012, 8:17 AM

## 2012-06-14 NOTE — Progress Notes (Signed)
CARE MANAGE MENT UTILIZATION REVIEW NOTE 06/14/2012     Patient:  Raymond Valdez, Raymond Valdez   Account Number:  [id number]  Documented by:  Alyssa Grove   Per Ur Regulation Reviewed for med. necessity/level of care/duration of stay

## 2012-07-03 LAB — MISCELLANEOUS TEST

## 2012-08-28 ENCOUNTER — Other Ambulatory Visit: Payer: Self-pay | Admitting: Family Medicine

## 2012-08-28 DIAGNOSIS — R4182 Altered mental status, unspecified: Secondary | ICD-10-CM

## 2012-09-02 ENCOUNTER — Ambulatory Visit
Admission: RE | Admit: 2012-09-02 | Discharge: 2012-09-02 | Disposition: A | Discharge status: Home / Self Care | Payer: Medicare Other | Source: Ambulatory Visit | Attending: Family Medicine | Admitting: Family Medicine

## 2012-09-02 DIAGNOSIS — R4182 Altered mental status, unspecified: Secondary | ICD-10-CM

## 2012-11-07 ENCOUNTER — Ambulatory Visit
Admission: RE | Admit: 2012-11-07 | Discharge: 2012-11-07 | Disposition: A | Discharge status: Home / Self Care | Payer: Medicare Other | Source: Ambulatory Visit | Attending: Rheumatology | Admitting: Rheumatology

## 2012-11-07 ENCOUNTER — Other Ambulatory Visit: Payer: Self-pay | Admitting: Rheumatology

## 2012-11-07 DIAGNOSIS — R609 Edema, unspecified: Secondary | ICD-10-CM

## 2012-11-07 DIAGNOSIS — M25561 Pain in right knee: Secondary | ICD-10-CM

## 2012-11-07 DIAGNOSIS — M25562 Pain in left knee: Secondary | ICD-10-CM

## 2013-02-11 ENCOUNTER — Other Ambulatory Visit: Payer: Self-pay | Admitting: Gastroenterology

## 2013-02-17 ENCOUNTER — Encounter (HOSPITAL_COMMUNITY): Payer: Self-pay | Admitting: Pharmacy Technician

## 2013-02-18 ENCOUNTER — Encounter (HOSPITAL_COMMUNITY): Payer: Self-pay | Admitting: *Deleted

## 2013-03-03 ENCOUNTER — Ambulatory Visit (HOSPITAL_COMMUNITY)
Admission: RE | Admit: 2013-03-03 | Discharge: 2013-03-03 | Disposition: A | Discharge status: Home / Self Care | Payer: Medicare Other | Source: Ambulatory Visit | Attending: Gastroenterology | Admitting: Gastroenterology

## 2013-03-03 ENCOUNTER — Encounter (HOSPITAL_COMMUNITY): Payer: Self-pay | Admitting: *Deleted

## 2013-03-03 ENCOUNTER — Encounter (HOSPITAL_COMMUNITY): Payer: Self-pay | Admitting: Anesthesiology

## 2013-03-03 ENCOUNTER — Ambulatory Visit (HOSPITAL_COMMUNITY): Payer: Medicare Other | Admitting: Anesthesiology

## 2013-03-03 ENCOUNTER — Encounter (HOSPITAL_COMMUNITY)
Admission: RE | Disposition: A | Discharge status: Home / Self Care | Payer: Self-pay | Source: Ambulatory Visit | Attending: Gastroenterology

## 2013-03-03 DIAGNOSIS — J45909 Unspecified asthma, uncomplicated: Secondary | ICD-10-CM | POA: Insufficient documentation

## 2013-03-03 DIAGNOSIS — M1990 Unspecified osteoarthritis, unspecified site: Secondary | ICD-10-CM | POA: Insufficient documentation

## 2013-03-03 DIAGNOSIS — D126 Benign neoplasm of colon, unspecified: Secondary | ICD-10-CM | POA: Insufficient documentation

## 2013-03-03 DIAGNOSIS — Z800 Family history of malignant neoplasm of digestive organs: Secondary | ICD-10-CM | POA: Insufficient documentation

## 2013-03-03 DIAGNOSIS — R195 Other fecal abnormalities: Secondary | ICD-10-CM | POA: Insufficient documentation

## 2013-03-03 DIAGNOSIS — Z7982 Long term (current) use of aspirin: Secondary | ICD-10-CM | POA: Insufficient documentation

## 2013-03-03 DIAGNOSIS — G20A1 Parkinson's disease without dyskinesia, without mention of fluctuations: Secondary | ICD-10-CM | POA: Insufficient documentation

## 2013-03-03 DIAGNOSIS — Z860100 Personal history of colon polyps, unspecified: Secondary | ICD-10-CM | POA: Insufficient documentation

## 2013-03-03 DIAGNOSIS — Z79899 Other long term (current) drug therapy: Secondary | ICD-10-CM | POA: Insufficient documentation

## 2013-03-03 DIAGNOSIS — Z791 Long term (current) use of non-steroidal anti-inflammatories (NSAID): Secondary | ICD-10-CM | POA: Insufficient documentation

## 2013-03-03 DIAGNOSIS — G20 Parkinson's disease: Secondary | ICD-10-CM | POA: Insufficient documentation

## 2013-03-03 HISTORY — DX: Parkinson's disease: G20

## 2013-03-03 HISTORY — PX: COLONOSCOPY WITH PROPOFOL: SHX5780

## 2013-03-03 HISTORY — DX: Malignant (primary) neoplasm, unspecified: C80.1

## 2013-03-03 LAB — HEMOGLOBIN AND HEMATOCRIT, BLOOD
HCT: 45.1 % (ref 39.0–52.0)
Hemoglobin: 14.9 g/dL (ref 13.0–17.0)

## 2013-03-03 SURGERY — COLONOSCOPY WITH PROPOFOL
Anesthesia: Monitor Anesthesia Care

## 2013-03-03 MED ORDER — SODIUM CHLORIDE 0.9 % IV SOLN
INTRAVENOUS | Status: DC
  Administered 2013-03-03: 13:00:00 via INTRAVENOUS

## 2013-03-03 MED ORDER — FENTANYL CITRATE 0.05 MG/ML IJ SOLN
INTRAMUSCULAR | Status: DC | PRN
  Administered 2013-03-03 (×2): 50 ug via INTRAVENOUS

## 2013-03-03 MED ORDER — KETAMINE HCL 10 MG/ML IJ SOLN
INTRAMUSCULAR | Status: DC | PRN
  Administered 2013-03-03: 20 mg via INTRAVENOUS

## 2013-03-03 MED ORDER — PROPOFOL INFUSION 10 MG/ML OPTIME
INTRAVENOUS | Status: DC | PRN
  Administered 2013-03-03: 160 ug/kg/min via INTRAVENOUS

## 2013-03-03 MED ORDER — ONDANSETRON HCL 4 MG/2ML IJ SOLN
INTRAMUSCULAR | Status: DC | PRN
  Administered 2013-03-03: 4 mg via INTRAVENOUS

## 2013-03-03 MED ORDER — MIDAZOLAM HCL 5 MG/5ML IJ SOLN
INTRAMUSCULAR | Status: DC | PRN
  Administered 2013-03-03: 2 mg via INTRAVENOUS

## 2013-03-03 MED ORDER — LIDOCAINE HCL (CARDIAC) 20 MG/ML IV SOLN
INTRAVENOUS | Status: DC | PRN
  Administered 2013-03-03: 100 mg via INTRAVENOUS

## 2013-03-03 SURGICAL SUPPLY — 21 items

## 2013-03-03 NOTE — Op Note (Signed)
Procedure: Diagnostic colonoscopy to evaluate Hemoccult-positive stool  Endoscopist: Danise Edge  Premedication: Propofol administered by anesthesia  Procedure the patient was placed in the left lateral decubitus position. Anal inspection and digital rectal exam were normal. A Pentax pediatric colonoscope was introduced into the rectum and advanced to the cecum as identified by a normal-appearing ileocecal valve and appendiceal orifice. Colonic preparation for the exam today was good.  Rectum. Normal. Retroflex view of the distal rectum normal.  Sigmoid colon and descending colon. Normal.  Splenic flexure. Normal.  Transverse colon. From the distal transverse colon a 5 mm sessile polyp was removed with the cold snare. Tissue was not retrieved for pathological evaluation.  Hepatic flexure. Normal.  Ascending colon. From the mid ascending colon, a 6 mm sessile polyp was removed with the cold snare and submitted for pathologic interpretation.  Cecum and ileocecal valve. Normal.  Assessment: A 6 mm ascending colon polyp was removed with the cold snare and submitted for pathological interpretation; a 5 mm sessile polyp was removed from the distal transverse colon but tissue was not retrieved for pathological evaluation.  Plan: Await colon polyp pathology.

## 2013-03-03 NOTE — Anesthesia Postprocedure Evaluation (Signed)
  Anesthesia Post-op Note  Patient: Raymond Valdez  Procedure(s) Performed: Procedure(s) (LRB): COLONOSCOPY WITH PROPOFOL (N/A)  Patient Location: PACU  Anesthesia Type: MAC  Level of Consciousness: awake and alert   Airway and Oxygen Therapy: Patient Spontanous Breathing  Post-op Pain: mild  Post-op Assessment: Post-op Vital signs reviewed, Patient's Cardiovascular Status Stable, Respiratory Function Stable, Patent Airway and No signs of Nausea or vomiting  Last Vitals:  Filed Vitals:   03/03/13 1434  BP: 126/75  Pulse:   Temp:   Resp:     Post-op Vital Signs: stable   Complications: No apparent anesthesia complications

## 2013-03-03 NOTE — Transfer of Care (Signed)
Immediate Anesthesia Transfer of Care Note  Patient: Raymond Valdez  Procedure(s) Performed: Procedure(s) (LRB): COLONOSCOPY WITH PROPOFOL (N/A)  Patient Location: PACU  Anesthesia Type: MAC  Level of Consciousness: sedated, patient cooperative and responds to stimulaton  Airway & Oxygen Therapy: Patient Spontanous Breathing and Patient connected to face mask oxgen  Post-op Assessment: Report given to PACU RN and Post -op Vital signs reviewed and stable  Post vital signs: Reviewed and stable  Complications: No apparent anesthesia complications

## 2013-03-03 NOTE — H&P (Signed)
  Procedure: Diagnostic colonoscopy to evaluate Hemoccult-positive stool  History: The patient is an 79 year old male born [birth year].  On 01/08/2009, the patient underwent a surveillance colonoscopy with removal of 3 adenomatous colon polyps. He was unable to undergo a repeat surveillance colonoscopy in 2013 due to his newly diagnosed Parkinson's disease.  The patient submitted 3 stool specimens for Hemoccult testing as part of his physical exam. Two of 3 stool specimens returned positive for blood. The patient's hemoglobin was 11.6 g. His serum iron saturation was 8%. His serum ferritin was 431 ng/mL associated with an elevation in the sedimentation rate.  The patient does not take nonsteroidal anti-inflammatory medication and reports no gastrointestinal bleeding or abdominal pain.  The patient did receive external beam radiation therapy for prostate cancer in the past.  The patient is scheduled to undergo a diagnostic colonoscopy to evaluate Hemoccult-positive stool.  Medication allergies: Sulfa  Chronic medications: Naproxen. Multivitamin. Fish oil. Baby aspirin.  Past medical and surgical history: Degenerative joint disease. Prostate cancer. Respiratory allergies. Parkinson's disease. Prostatectomy. Appendectomy. External beam radiation therapy for prostate cancer.  Family history: Father diagnosed with colon cancer.  Habits: The patient quit smoking cigarettes and consumes no alcohol.  Plan: Proceed with diagnostic colonoscopy to evaluate Hemoccult-positive stool.

## 2013-03-03 NOTE — Discharge Instructions (Signed)
Monitored Anesthesia Care (MAC)  MAC stands for monitored anesthesia care. MAC usually means a tube is not put in your trachea (windpipe). MAC may also be called moderate sedation. MAC usually involves giving intravenous anesthetic drugs, oxygen, watching vital signs and standard patient monitoring procedures similar to those used during a general anesthetic. MAC can be done without going to the operating room. MAC is typically used for small procedures that cannot be done with only local anesthesia. MAC usually means lower doses of anesthetic drugs. The recovery period tends to be shorter. The drugs used cause a lower level of awareness. This means you are partially awake and your reflexes are intact. You may hear what is being said and feel some pressure, but should not feel pain. The drugs used may affect your ability to remember the procedure. If you have depressed consciousness and lose some protective reflexes, this is called deep sedation. If you become unconscious and fall completely asleep, this is general anesthesia. In both deep sedation and general anesthesia, the caregivers must make sure that your airway remains open. During MAC, the sedation-trained caregivers will:  Give medications which may include:  Sedatives.  Analgesics.  Hypnotics.  Other medications which are needed to keep you comfortable, safe and secure.  Give local anesthetic to numb the procedural site.  Monitor your level of consciousness.  Monitor your blood pressure.  Monitor your heart rate and rhythm.  Monitor your respirations and oxygen levels.  Monitor your airway.  Monitor your level of pain.  Evaluate and treat problems which may occur. Document Released: 08/09/2005 Document Revised: 02/05/2012 Document Reviewed: 10/12/2009 [facility] Patient Information 2013 [place], [place]. Colonoscopy Care After Read the instructions outlined below and refer to this sheet in the next few weeks. These discharge  instructions provide you with general information on caring for yourself after you leave the hospital. Your doctor may also give you specific instructions. While your treatment has been planned according to the most current medical practices available, unavoidable complications occasionally occur. If you have any problems or questions after discharge, call your doctor. HOME CARE INSTRUCTIONS ACTIVITY:  You may resume your regular activity, but move at a slower pace for the next 24 hours.  Take frequent rest periods for the next 24 hours.  Walking will help get rid of the air and reduce the bloated feeling in your belly (abdomen).  No driving for 24 hours (because of the medicine (anesthesia) used during the test).  You may shower.  Do not sign any important legal documents or operate any machinery for 24 hours (because of the anesthesia used during the test). NUTRITION:  Drink plenty of fluids.  You may resume your normal diet as instructed by your doctor.  Begin with a light meal and progress to your normal diet. Heavy or fried foods are harder to digest and may make you feel sick to your stomach (nauseated).  Avoid alcoholic beverages for 24 hours or as instructed. MEDICATIONS:  You may resume your normal medications unless your doctor tells you otherwise. WHAT TO EXPECT TODAY:  Some feelings of bloating in the abdomen.  Passage of more gas than usual.  Spotting of blood in your stool or on the toilet paper. IF YOU HAD POLYPS REMOVED DURING THE COLONOSCOPY:  No aspirin products for 7 days or as instructed.  No alcohol for 7 days or as instructed.  Eat a soft diet for the next 24 hours. FINDING OUT THE RESULTS OF YOUR TEST Not all test results  are available during your visit. If your test results are not back during the visit, make an appointment with your caregiver to find out the results. Do not assume everything is normal if you have not heard from your caregiver or the  medical facility. It is important for you to follow up on all of your test results.  SEEK IMMEDIATE MEDICAL CARE IF:  You have more than a spotting of blood in your stool.  Your belly is swollen (abdominal distention).  You are nauseated or vomiting.  You have a fever.  You have abdominal pain or discomfort that is severe or gets worse throughout the day. Document Released: 06/27/2004 Document Revised: 02/05/2012 Document Reviewed: 06/25/2008 [facility] Patient Information 2013 [place], [place].

## 2013-03-03 NOTE — Preoperative (Signed)
Beta Blockers   Reason not to administer Beta Blockers:Not Applicable 

## 2013-03-03 NOTE — Anesthesia Preprocedure Evaluation (Addendum)
Anesthesia Evaluation  Patient identified by MRN, date of birth, ID band Patient awake    Reviewed: Allergy & Precautions, H&P , NPO status , Patient's Chart, lab work & pertinent test results  Airway Mallampati: II TM Distance: >3 FB Neck ROM: Limited    Dental no notable dental hx.    Pulmonary neg pulmonary ROS,  breath sounds clear to auscultation  Pulmonary exam normal       Cardiovascular Rhythm:Regular Rate:Normal  RBBB   Neuro/Psych parkinsons dz negative psych ROS   GI/Hepatic negative GI ROS, Neg liver ROS,   Endo/Other  negative endocrine ROS  Renal/GU negative Renal ROS  negative genitourinary   Musculoskeletal negative musculoskeletal ROS (+)   Abdominal   Peds negative pediatric ROS (+)  Hematology negative hematology ROS (+)   Anesthesia Other Findings   Reproductive/Obstetrics negative OB ROS                          Anesthesia Physical Anesthesia Plan  ASA: III  Anesthesia Plan: MAC   Post-op Pain Management:    Induction: Intravenous  Airway Management Planned: Simple Face Mask  Additional Equipment:   Intra-op Plan:   Post-operative Plan:   Informed Consent:   Plan Discussed with: CRNA and Surgeon  Anesthesia Plan Comments:         Anesthesia Quick Evaluation

## 2013-03-04 ENCOUNTER — Encounter (HOSPITAL_COMMUNITY): Payer: Self-pay | Admitting: Gastroenterology

## 2013-03-28 ENCOUNTER — Telehealth: Payer: Self-pay | Admitting: Family Medicine

## 2013-03-28 NOTE — Telephone Encounter (Signed)
Please advise 

## 2013-03-28 NOTE — Telephone Encounter (Signed)
Make sure patient doesn't need anything

## 2013-04-25 ENCOUNTER — Ambulatory Visit (INDEPENDENT_AMBULATORY_CARE_PROVIDER_SITE_OTHER): Payer: Medicare Other | Admitting: Family Medicine

## 2013-04-25 ENCOUNTER — Encounter: Payer: Self-pay | Admitting: Family Medicine

## 2013-04-25 VITALS — BP 136/73 | HR 61 | Temp 97.7°F | Wt 188.0 lb

## 2013-04-25 DIAGNOSIS — G20 Parkinson's disease: Secondary | ICD-10-CM

## 2013-04-25 DIAGNOSIS — R609 Edema, unspecified: Secondary | ICD-10-CM

## 2013-04-25 DIAGNOSIS — R7309 Other abnormal glucose: Secondary | ICD-10-CM

## 2013-04-25 DIAGNOSIS — M4800 Spinal stenosis, site unspecified: Secondary | ICD-10-CM

## 2013-04-25 DIAGNOSIS — H3530 Unspecified macular degeneration: Secondary | ICD-10-CM

## 2013-04-25 DIAGNOSIS — G609 Hereditary and idiopathic neuropathy, unspecified: Secondary | ICD-10-CM

## 2013-04-25 DIAGNOSIS — I4949 Other premature depolarization: Secondary | ICD-10-CM

## 2013-04-25 DIAGNOSIS — IMO0002 Reserved for concepts with insufficient information to code with codable children: Secondary | ICD-10-CM

## 2013-04-25 DIAGNOSIS — Z8546 Personal history of malignant neoplasm of prostate: Secondary | ICD-10-CM

## 2013-04-25 DIAGNOSIS — I493 Ventricular premature depolarization: Secondary | ICD-10-CM

## 2013-04-25 DIAGNOSIS — G629 Polyneuropathy, unspecified: Secondary | ICD-10-CM

## 2013-04-25 DIAGNOSIS — R739 Hyperglycemia, unspecified: Secondary | ICD-10-CM

## 2013-04-25 DIAGNOSIS — G9340 Encephalopathy, unspecified: Secondary | ICD-10-CM

## 2013-04-25 DIAGNOSIS — R29898 Other symptoms and signs involving the musculoskeletal system: Secondary | ICD-10-CM

## 2013-04-25 DIAGNOSIS — G20A1 Parkinson's disease without dyskinesia, without mention of fluctuations: Secondary | ICD-10-CM

## 2013-04-25 DIAGNOSIS — R229 Localized swelling, mass and lump, unspecified: Secondary | ICD-10-CM

## 2013-04-25 LAB — POCT GLYCOSYLATED HEMOGLOBIN (HGB A1C): Hemoglobin A1C: 5.3

## 2013-04-25 NOTE — Progress Notes (Signed)
Patient ID: Raymond Valdez, male   DOB: 06/25/1929, 79 y.o.   MRN: [id number] SUBJECTIVE: HPI: Home health checked his strong smelling urine and he had a UTI. Saw [facility] and had bladder evaluation. Still has a bacteruria. Had neurologist work up in the past on the tibial nerve.and the urologist has been doing research on tibial nerve and bladder problems.They are doing a tibial nerve stimulation for the treatment of his OAB symptoms. 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 Next visit with the Urologist they were going to try the left tibial nerve. Dr Enid Derry wanted me to research in the records to see if he was correct.  Otherwise all his other problems are stable. He would also like to see a neurologist in [place] for follow up on his Parkinson's disease since Dr Sandria Manly has retired ad it is too much for them to drive to [place] where he was diagnosed.   PMH/PSH: reviewed/updated in Epic  SH/FH: reviewed/updated in Epic  Allergies: reviewed/updated in Epic  Medications: reviewed/updated in Epic  Immunizations: reviewed/updated in Epic  ROS: As above in the HPI. All other systems are stable or negative.  OBJECTIVE: APPEARANCE:  Patient in no acute distress.The patient appeared well nourished and normally developed. Acyanotic. Waist: VITAL SIGNS:BP 136/73  Pulse 61  Temp(Src) 97.7 F (36.5 C) (Oral)  Wt 188 lb (85.276 kg)  BMI 28.59 kg/m2 WM Kyphotic Elderly frail. Ambulating fairly well Accompanied by his frail wife. Flat facies  SKIN: warm and  Dry without overt rashes, tattoos and scars  HEAD and Neck: without JVD, Head and scalp: normal Eyes:No scleral icterus. Fundi normal, eye movements normal. Ears: Auricle normal, canal normal, Tympanic membranes normal, insufflation normal. Nose: normal Throat: normal Neck & thyroid: normal  CHEST &  LUNGS: Chest wall: normal Lungs: Clear  CVS: Reveals the PMI to be normally located. Regular rhythm, First and Second Heart sounds are normal,  absence of murmurs, rubs or gallops. Peripheral vasculature: Radial pulses: normal Dorsal pedis pulses: normal Posterior pulses: normal  ABDOMEN:  Appearance: normal Benign,, no organomegaly, no masses, no Abdominal Aortic enlargement. No Guarding , no rebound. No Bruits. Bowel sounds: normal  RECTAL: N/A GU: N/A  EXTREMETIES: trace edema  Pedal pulses are normal.  MUSCULOSKELETAL:  Spine: kyphosis   NEUROLOGIC: oriented to time,place and person;Strength is normal Features of Parkinson's disease with mild shuffling gait and flat facies Sensory loss due to the spinal stenosis and right tibial neuropathy.  Review of records revealed that he did have MRI and NCS involving the lower extremities reflecting L4 to S1 neuropathy affected by spinal stenosis. ork up by Dr Sandria Manly in 2012.   ASSESSMENT: Parkinson's disease - Plan: Ambulatory referral to Neurology, CANCELED: Ambulatory referral to Neurology  Hyperglycemia - Plan: POCT glycosylated hemoglobin (Hb A1C), BASIC METABOLIC PANEL WITH GFR  Spinal stenosis  PVC (premature ventricular contraction)  Macular degeneration  Peripheral neuropathy, chronic.  Lower extremity weakness in a patient with a history of chronic peripheral neuropathy and mild lumbar spinal stenosis as well as Bartonella infection  History of prostate cancer  Encephalopathy acute, toxic and metabolic  Edema  C1 Mass  PLAN: Orders Placed This Encounter  Procedures  . BASIC METABOLIC PANEL WITH GFR  . Ambulatory referral to Neurology    Referral Priority:  Routine    Referral Type:  Consultation    Referral Reason:  Specialty Services Required  Requested Specialty:  Neurology    Number of Visits Requested:  1  . POCT glycosylated hemoglobin (Hb A1C)   Results for orders placed in visit on  04/25/13  POCT GLYCOSYLATED HEMOGLOBIN (HGB A1C)      Result Value Range   Hemoglobin A1C 5.3 %     No orders of the defined types were placed in this encounter.   Reviewed medications and recent Urologic intervention. No other changes at present.  Return in about 3 months (around 07/26/2013) for Recheck medical problems.  Raymond Milliron P. Raymond Valdez, M.D.

## 2013-04-26 LAB — BASIC METABOLIC PANEL WITH GFR
BUN: 16 mg/dL (ref 6–23)
CO2: 29 mEq/L (ref 19–32)
Calcium: 8.9 mg/dL (ref 8.4–10.5)
Chloride: 102 mEq/L (ref 96–112)
Creat: 0.87 mg/dL (ref 0.50–1.35)
GFR, Est African American: >89 mL/min
GFR, Est Non African American: 80 mL/min
Glucose, Bld: 91 mg/dL (ref 70–99)
Potassium: 4.2 mEq/L (ref 3.5–5.3)
Sodium: 140 mEq/L (ref 135–145)

## 2013-04-28 NOTE — Progress Notes (Signed)
Quick Note:  Lab result at goal. No change in Medications for now. No Change in plans and follow up. ______ 

## 2013-07-31 ENCOUNTER — Ambulatory Visit (INDEPENDENT_AMBULATORY_CARE_PROVIDER_SITE_OTHER): Payer: Medicare Other | Admitting: Family Medicine

## 2013-07-31 ENCOUNTER — Encounter: Payer: Self-pay | Admitting: Family Medicine

## 2013-07-31 VITALS — BP 161/79 | HR 67 | Temp 97.3°F | Wt 184.4 lb

## 2013-07-31 DIAGNOSIS — G20 Parkinson's disease: Secondary | ICD-10-CM

## 2013-07-31 DIAGNOSIS — G629 Polyneuropathy, unspecified: Secondary | ICD-10-CM

## 2013-07-31 DIAGNOSIS — G20A1 Parkinson's disease without dyskinesia, without mention of fluctuations: Secondary | ICD-10-CM

## 2013-07-31 DIAGNOSIS — H3530 Unspecified macular degeneration: Secondary | ICD-10-CM

## 2013-07-31 DIAGNOSIS — N529 Male erectile dysfunction, unspecified: Secondary | ICD-10-CM

## 2013-07-31 DIAGNOSIS — M4800 Spinal stenosis, site unspecified: Secondary | ICD-10-CM

## 2013-07-31 DIAGNOSIS — G609 Hereditary and idiopathic neuropathy, unspecified: Secondary | ICD-10-CM

## 2013-07-31 DIAGNOSIS — I4949 Other premature depolarization: Secondary | ICD-10-CM

## 2013-07-31 DIAGNOSIS — R29898 Other symptoms and signs involving the musculoskeletal system: Secondary | ICD-10-CM

## 2013-07-31 DIAGNOSIS — R739 Hyperglycemia, unspecified: Secondary | ICD-10-CM | POA: Insufficient documentation

## 2013-07-31 DIAGNOSIS — D649 Anemia, unspecified: Secondary | ICD-10-CM

## 2013-07-31 DIAGNOSIS — I493 Ventricular premature depolarization: Secondary | ICD-10-CM

## 2013-07-31 DIAGNOSIS — R7309 Other abnormal glucose: Secondary | ICD-10-CM

## 2013-07-31 DIAGNOSIS — Z8546 Personal history of malignant neoplasm of prostate: Secondary | ICD-10-CM

## 2013-07-31 DIAGNOSIS — A449 Bartonellosis, unspecified: Secondary | ICD-10-CM

## 2013-07-31 LAB — POCT CBC
Granulocyte percent: 71.4 %G (ref 37–80)
HCT, POC: 48.7 % (ref 43.5–53.7)
Hemoglobin: 16.2 g/dL (ref 14.1–18.1)
Lymph, poc: 2.1 (ref 0.6–3.4)
MCH, POC: 30.8 pg (ref 27–31.2)
MCHC: 33.3 g/dL (ref 31.8–35.4)
MCV: 92.3 fL (ref 80–97)
MPV: 7.9 fL (ref 0–99.8)
POC Granulocyte: 5.8 (ref 2–6.9)
POC LYMPH PERCENT: 25.4 %L (ref 10–50)
Platelet Count, POC: 134 10*3/uL — AB (ref 142–424)
RBC: 5.3 M/uL (ref 4.69–6.13)
RDW, POC: 14.4 %
WBC: 8.1 10*3/uL (ref 4.6–10.2)

## 2013-07-31 MED ORDER — SILDENAFIL CITRATE 100 MG PO TABS: 100.0000 mg | ORAL_TABLET | Freq: Every day | ORAL | Status: DC | PRN

## 2013-07-31 NOTE — Progress Notes (Signed)
Patient ID: Raymond Valdez, male   DOB: 04-08-1929, 79 y.o.   MRN: [id number] SUBJECTIVE: CC: Chief Complaint  Patient presents with  . Follow-up    3 month follow up     HPI: Here for follow up of his medical problems He is doing fairly well and he and his wife are at baseline at present. No new complaints. BP was a little  elevated today. Needs a renewal for Viagra.  Past Medical History  Diagnosis Date  . Macular degeneration   . History of prostate cancer   . Infection, bartonella   . Spinal stenosis   . Parkinson's disease 05-2012  . Cancer   . Hyperglycemia    Past Surgical History  Procedure Laterality Date  . Appendectomy    . Tonsillectomy    . Prostate surgery  1998    surgery and then radiation 7 years later in 2005  . Colonoscopy with propofol N/A 03/03/2013    Procedure: COLONOSCOPY WITH PROPOFOL;  Surgeon: Charolett Bumpers, MD;  Location: [facility];  Service: Endoscopy;  Laterality: N/A;   History   Social History  . Marital Status: Married    Spouse Name: N/A    Number of Children: N/A  . Years of Education: N/A   Occupational History  . Not on file.   Social History Main Topics  . Smoking status: Former Smoker -- 0.25 packs/day for 10 years    Quit date: 11/27/1970  . Smokeless tobacco: Never Used  . Alcohol Use: Yes  . Drug Use: No  . Sexual Activity: Yes    Partners: Female   Other Topics Concern  . Not on file   Social History Narrative  . No narrative on file   No family history on file. Current Outpatient Prescriptions on File Prior to Visit  Medication Sig Dispense Refill  . acetaminophen (TYLENOL) 500 MG tablet Take 500 mg by mouth every 6 (six) hours as needed for pain.      . beta carotene w/minerals (OCUVITE) tablet Take 1 tablet by mouth daily.      . carbidopa-levodopa (SINEMET IR) 25-100 MG per tablet Take 1.5 tablets by mouth 3 (three) times daily.      . ferrous sulfate 325 (65 FE) MG tablet Take 325 mg by mouth daily  with breakfast.      . folic acid (FOLVITE) 400 MCG tablet Take 400 mcg by mouth daily.      . hydroxypropyl methylcellulose (ISOPTO TEARS) 2.5 % ophthalmic solution Place 1 drop into both eyes as needed (dry eyes).      . mirabegron ER (MYRBETRIQ) 25 MG TB24 Take 25 mg by mouth daily.       No current facility-administered medications on file prior to visit.   Allergies  Allergen Reactions  . Sulfa Antibiotics Itching and Rash  . Sulfa Drugs Cross Reactors Itching and Rash    Tribrisen, a sulfa potentiator, also causes reaction in patient, as does sulfa  . Ciprofloxacin    Immunization History  Administered Date(s) Administered  . Td 05/12/2004  . Zoster 10/16/2007   Prior to Admission medications   Medication Sig Start Date End Date Taking? Authorizing Provider  acetaminophen (TYLENOL) 500 MG tablet Take 500 mg by mouth every 6 (six) hours as needed for pain.   Yes Historical Provider, MD  beta carotene w/minerals (OCUVITE) tablet Take 1 tablet by mouth daily.   Yes Historical Provider, MD  carbidopa-levodopa (SINEMET IR) 25-100 MG per tablet Take 1.5  tablets by mouth 3 (three) times daily.   Yes Historical Provider, MD  ferrous sulfate 325 (65 FE) MG tablet Take 325 mg by mouth daily with breakfast.   Yes Historical Provider, MD  folic acid (FOLVITE) 400 MCG tablet Take 400 mcg by mouth daily.   Yes Historical Provider, MD  hydroxypropyl methylcellulose (ISOPTO TEARS) 2.5 % ophthalmic solution Place 1 drop into both eyes as needed (dry eyes).   Yes Historical Provider, MD  mirabegron ER (MYRBETRIQ) 25 MG TB24 Take 25 mg by mouth daily.   Yes Historical Provider, MD  sildenafil (VIAGRA) 100 MG tablet Take 1 tablet (100 mg total) by mouth daily as needed for erectile dysfunction. 07/31/13   Ileana Ladd, MD     ROS: As above in the HPI. All other systems are stable or negative.  OBJECTIVE: APPEARANCE:  Patient in no acute distress.The patient appeared well nourished and  normally developed. Acyanotic. Waist: VITAL SIGNS:BP 161/79  Pulse 67  Temp(Src) 97.3 F (36.3 C) (Oral)  Wt 184 lb 6.4 oz (83.643 kg)  BMI 28.04 kg/m2 bprecheck 140/85 WM  SKIN: warm and  Dry without overt rashes, tattoos and scars  HEAD and Neck: without JVD, Head and scalp: normal. Flat facies Eyes:No scleral icterus. Fundi normal, eye movements normal. Ears: Auricle normal, canal normal, Tympanic membranes normal, insufflation normal. Nose: normal Throat: normal Neck & thyroid: normal  CHEST & LUNGS: Chest wall: normal Lungs: Clear  CVS: Reveals the PMI to be normally located. Regular rhythm, First and Second Heart sounds are normal,  absence of murmurs, rubs or gallops. Peripheral vasculature: Radial pulses: normal Dorsal pedis pulses: normal Posterior pulses: normal  ABDOMEN:  Appearance: normal Benign, no organomegaly, no masses, no Abdominal Aortic enlargement. No Guarding , no rebound. No Bruits. Bowel sounds: normal  RECTAL: N/A GU: N/A  EXTREMETIES: nonedematous.  MUSCULOSKELETAL:  Spine: normal Joints: intact  NEUROLOGIC: oriented to time,place and person; nonfocal in the extremities Features of Parkinson's disease with difficulty initiating propulsion.stiffness.   ASSESSMENT: Hyperglycemia - Plan: CMP14+EGFR, Iron, Ferritin  Parkinson's disease  History of prostate cancer - Plan: PSA, total and free  Lower extremity weakness in a patient with a history of chronic peripheral neuropathy and mild lumbar spinal stenosis as well as Bartonella infection  Infection, bartonella  Macular degeneration  PVC (premature ventricular contraction)  Peripheral neuropathy, chronic. - Plan: POCT CBC, CMP14+EGFR, Folate, Vitamin B12, Vit D  25 hydroxy (rtn osteoporosis monitoring), TSH  Spinal stenosis  Anemia - Plan: POCT CBC  Erectile dysfunction - Plan: sildenafil (VIAGRA) 100 MG tablet   PLAN: Orders Placed This Encounter  Procedures  .  CMP14+EGFR  . Folate  . Vitamin B12  . Vit D  25 hydroxy (rtn osteoporosis monitoring)  . TSH  . PSA, total and free  . Iron  . Ferritin  . POCT CBC    Meds ordered this encounter  Medications  . sildenafil (VIAGRA) 100 MG tablet    Sig: Take 1 tablet (100 mg total) by mouth daily as needed for erectile dysfunction.    Dispense:  10 tablet    Refill:  5   Continue present level of care. Monitor BP.If elevated will need treatment  Return in about 4 months (around 11/30/2013) for Recheck medical problems.  Catelyn Friel P. Modesto Charon, M.D.

## 2013-08-01 LAB — CMP14+EGFR
ALT: 16 IU/L (ref 0–44)
AST: 19 IU/L (ref 0–40)
Albumin/Globulin Ratio: 1.6 (ref 1.1–2.5)
Albumin: 4.1 g/dL (ref 3.5–4.7)
Alkaline Phosphatase: 63 IU/L (ref 39–117)
BUN/Creatinine Ratio: 22 (ref 10–22)
BUN: 16 mg/dL (ref 8–27)
CO2: 25 mmol/L (ref 18–29)
Calcium: 9.1 mg/dL (ref 8.6–10.2)
Chloride: 100 mmol/L (ref 97–108)
Creatinine, Ser: 0.73 mg/dL — ABNORMAL LOW (ref 0.76–1.27)
GFR calc Af Amer: 100 mL/min/{1.73_m2} (ref >59)
GFR calc non Af Amer: 86 mL/min/{1.73_m2} (ref >59)
Globulin, Total: 2.6 g/dL (ref 1.5–4.5)
Glucose: 86 mg/dL (ref 65–99)
Potassium: 4.5 mmol/L (ref 3.5–5.2)
Sodium: 142 mmol/L (ref 134–144)
Total Bilirubin: 0.8 mg/dL (ref 0.0–1.2)
Total Protein: 6.7 g/dL (ref 6.0–8.5)

## 2013-08-01 LAB — TSH: TSH: 1.99 u[IU]/mL (ref 0.450–4.500)

## 2013-08-01 LAB — VITAMIN D 25 HYDROXY (VIT D DEFICIENCY, FRACTURES): Vit D, 25-Hydroxy: 32 ng/mL (ref 30.0–100.0)

## 2013-08-01 LAB — PSA, TOTAL AND FREE
PSA, Free Pct: >10 %
PSA, Free: 0.01 ng/mL
PSA: <0.1 ng/mL (ref 0.0–4.0)

## 2013-08-01 LAB — FOLATE: Folate: >19.9 ng/mL (ref >3.0)

## 2013-08-01 LAB — VITAMIN B12: Vitamin B-12: 404 pg/mL (ref 211–946)

## 2013-08-01 LAB — FERRITIN: Ferritin: 209 ng/mL (ref 30–400)

## 2013-08-01 LAB — IRON: Iron: 140 ug/dL (ref 40–155)

## 2013-08-06 ENCOUNTER — Other Ambulatory Visit: Payer: Self-pay | Admitting: Family Medicine

## 2013-08-06 DIAGNOSIS — R899 Unspecified abnormal finding in specimens from other organs, systems and tissues: Secondary | ICD-10-CM

## 2013-08-06 NOTE — Progress Notes (Signed)
Quick Note:  Call patient. Labs normal.except the platelets. Borderline low. We should check a CBC again in 2 weeks.ordered in EPIC No change in plan. ______

## 2013-08-21 ENCOUNTER — Other Ambulatory Visit (INDEPENDENT_AMBULATORY_CARE_PROVIDER_SITE_OTHER): Payer: Medicare Other

## 2013-08-21 DIAGNOSIS — R899 Unspecified abnormal finding in specimens from other organs, systems and tissues: Secondary | ICD-10-CM

## 2013-08-21 DIAGNOSIS — R6889 Other general symptoms and signs: Secondary | ICD-10-CM

## 2013-08-22 LAB — CBC WITH DIFFERENTIAL
Basophils Absolute: 0 10*3/uL (ref 0.0–0.2)
Basos: 0 %
Eos: 2 %
Eosinophils Absolute: 0.1 10*3/uL (ref 0.0–0.4)
HCT: 43.5 % (ref 37.5–51.0)
Hemoglobin: 14.9 g/dL (ref 12.6–17.7)
Immature Grans (Abs): 0 10*3/uL (ref 0.0–0.1)
Immature Granulocytes: 0 %
Lymphocytes Absolute: 2 10*3/uL (ref 0.7–3.1)
Lymphs: 27 %
MCH: 31.6 pg (ref 26.6–33.0)
MCHC: 34.3 g/dL (ref 31.5–35.7)
MCV: 92 fL (ref 79–97)
Monocytes Absolute: 0.5 10*3/uL (ref 0.1–0.9)
Monocytes: 7 %
Neutrophils Absolute: 4.8 10*3/uL (ref 1.4–7.0)
Neutrophils Relative %: 64 %
Platelets: 149 10*3/uL — ABNORMAL LOW (ref 150–379)
RBC: 4.72 x10E6/uL (ref 4.14–5.80)
RDW: 14.1 % (ref 12.3–15.4)
WBC: 7.5 10*3/uL (ref 3.4–10.8)

## 2013-08-27 NOTE — Progress Notes (Signed)
Patient came in for labs only.

## 2013-11-12 ENCOUNTER — Ambulatory Visit (INDEPENDENT_AMBULATORY_CARE_PROVIDER_SITE_OTHER): Payer: Medicare Other | Admitting: Family Medicine

## 2013-11-12 ENCOUNTER — Encounter: Payer: Self-pay | Admitting: Family Medicine

## 2013-11-12 VITALS — BP 130/90 | Temp 97.5°F | Ht 68.0 in | Wt 184.0 lb

## 2013-11-12 DIAGNOSIS — R300 Dysuria: Secondary | ICD-10-CM

## 2013-11-12 LAB — POCT UA - MICROSCOPIC ONLY
Casts, Ur, LPF, POC: NEGATIVE
Mucus, UA: NEGATIVE
Yeast, UA: NEGATIVE

## 2013-11-12 LAB — POCT URINALYSIS DIPSTICK
Glucose, UA: NEGATIVE
Nitrite, UA: NEGATIVE
Urobilinogen, UA: NEGATIVE

## 2013-11-12 MED ORDER — CEPHALEXIN 500 MG PO CAPS: 500.0000 mg | ORAL_CAPSULE | Freq: Three times a day (TID) | ORAL | Status: DC

## 2013-11-12 NOTE — Progress Notes (Signed)
   Subjective:    Patient ID: Raymond Valdez, male    DOB: [birth year], 79 y.o.   MRN: [id number]  HPI Pt presents today with chief complaint of strong urine odor and mild altered mentation.  Pt has a baseline hx/o parkinson's disease.  Has multiple caregivers at home.  Per wife, caregivers have reported pt with increased agitation.  Has also had strong smelling urine.  Had similar episode 1-2 months ago, clinically had UTI at the time.  No fevers or chills.  S/p prostatectomy for prostate ca. Allergic to cipro.  Has taken keflex in the past with resolution of sxs.    Review of Systems  All other systems reviewed and are negative.       Objective:   Physical Exam  Constitutional:  Elderly, ambulating with cane     HENT:  Head: Normocephalic and atraumatic.  Eyes: Conjunctivae are normal. Pupils are equal, round, and reactive to light.  Neck: Normal range of motion. Neck supple.  Cardiovascular: Normal rate and regular rhythm.   Pulmonary/Chest: Effort normal and breath sounds normal.  Abdominal: Soft. Bowel sounds are normal.  Mild R sided flank pain  No suprapubic tenderness   Musculoskeletal: Normal range of motion.  Neurological: He is alert.  Skin: Skin is warm.   Filed Vitals:   11/12/13 1050  BP: 130/90  Temp: 97.5 F (36.4 C)           Assessment & Plan:  Dysuria - Plan: POCT UA - Microscopic Only, POCT urinalysis dipstick, cephALEXin (KEFLEX) 500 MG capsule, Urine culture  Will place on extended course of keflex Urine culture Discussed general and infectious red flags at length with pt and wife Consider urology referral if sxs persist despite treatment.  Follow up as needed.

## 2013-11-14 LAB — URINE CULTURE

## 2013-12-02 ENCOUNTER — Ambulatory Visit: Payer: Medicare Other | Admitting: Family Medicine

## 2013-12-19 ENCOUNTER — Encounter: Payer: Self-pay | Admitting: Family Medicine

## 2013-12-19 ENCOUNTER — Ambulatory Visit (INDEPENDENT_AMBULATORY_CARE_PROVIDER_SITE_OTHER): Payer: Medicare Other | Admitting: Family Medicine

## 2013-12-19 VITALS — BP 122/72 | HR 66 | Temp 98.2°F | Ht 65.0 in | Wt 188.0 lb

## 2013-12-19 DIAGNOSIS — I4949 Other premature depolarization: Secondary | ICD-10-CM

## 2013-12-19 DIAGNOSIS — Z23 Encounter for immunization: Secondary | ICD-10-CM

## 2013-12-19 DIAGNOSIS — G20 Parkinson's disease: Secondary | ICD-10-CM

## 2013-12-19 DIAGNOSIS — G609 Hereditary and idiopathic neuropathy, unspecified: Secondary | ICD-10-CM

## 2013-12-19 DIAGNOSIS — G629 Polyneuropathy, unspecified: Secondary | ICD-10-CM

## 2013-12-19 DIAGNOSIS — R29898 Other symptoms and signs involving the musculoskeletal system: Secondary | ICD-10-CM

## 2013-12-19 DIAGNOSIS — N390 Urinary tract infection, site not specified: Secondary | ICD-10-CM

## 2013-12-19 DIAGNOSIS — I493 Ventricular premature depolarization: Secondary | ICD-10-CM

## 2013-12-19 DIAGNOSIS — D696 Thrombocytopenia, unspecified: Secondary | ICD-10-CM

## 2013-12-19 DIAGNOSIS — Z8546 Personal history of malignant neoplasm of prostate: Secondary | ICD-10-CM

## 2013-12-19 DIAGNOSIS — A449 Bartonellosis, unspecified: Secondary | ICD-10-CM

## 2013-12-19 DIAGNOSIS — M4800 Spinal stenosis, site unspecified: Secondary | ICD-10-CM

## 2013-12-19 DIAGNOSIS — H3530 Unspecified macular degeneration: Secondary | ICD-10-CM

## 2013-12-19 DIAGNOSIS — R7309 Other abnormal glucose: Secondary | ICD-10-CM

## 2013-12-19 DIAGNOSIS — G9340 Encephalopathy, unspecified: Secondary | ICD-10-CM

## 2013-12-19 DIAGNOSIS — G20A1 Parkinson's disease without dyskinesia, without mention of fluctuations: Secondary | ICD-10-CM

## 2013-12-19 DIAGNOSIS — R739 Hyperglycemia, unspecified: Secondary | ICD-10-CM

## 2013-12-19 LAB — POCT CBC
Granulocyte percent: 59.3 %G (ref 37–80)
HCT, POC: 45.7 % (ref 43.5–53.7)
Hemoglobin: 14.9 g/dL (ref 14.1–18.1)
Lymph, poc: 2.5 (ref 0.6–3.4)
MCH, POC: 30.2 pg (ref 27–31.2)
MCHC: 32.5 g/dL (ref 31.8–35.4)
MCV: 93 fL (ref 80–97)
MPV: 8.6 fL (ref 0–99.8)
POC Granulocyte: 4.6 (ref 2–6.9)
POC LYMPH PERCENT: 32.5 %L (ref 10–50)
Platelet Count, POC: 115 10*3/uL — AB (ref 142–424)
RBC: 4.9 M/uL (ref 4.69–6.13)
RDW, POC: 14.7 %
WBC: 7.8 10*3/uL (ref 4.6–10.2)

## 2013-12-19 LAB — POCT UA - MICROSCOPIC ONLY
Bacteria, U Microscopic: NEGATIVE
Casts, Ur, LPF, POC: NEGATIVE
Crystals, Ur, HPF, POC: NEGATIVE
Mucus, UA: NEGATIVE
RBC, urine, microscopic: NEGATIVE
Yeast, UA: NEGATIVE

## 2013-12-19 LAB — POCT URINALYSIS DIPSTICK
Bilirubin, UA: NEGATIVE
Blood, UA: NEGATIVE
Glucose, UA: NEGATIVE
Ketones, UA: NEGATIVE
Nitrite, UA: NEGATIVE
Protein, UA: NEGATIVE
Spec Grav, UA: 1.015
Urobilinogen, UA: NEGATIVE
pH, UA: 6.5

## 2013-12-19 MED ORDER — PNEUMOCOCCAL 13-VAL CONJ VACC IM SUSP: 0.5000 mL | Freq: Once | INTRAMUSCULAR | Status: DC

## 2013-12-19 NOTE — Patient Instructions (Signed)

## 2013-12-19 NOTE — Progress Notes (Signed)
Patient ID: Raymond Valdez, male   DOB: Jul 04, 1929, 80 y.o.   MRN: [id number] SUBJECTIVE: CC: Chief Complaint  Patient presents with  . Follow-up    4 month follow up    HPI: Here for follow up onhis Parkinson's disease, hyperglycemia and other medical problems. Has had injections in his eyes for macular degeneration. That is going well. Spinal stenosis is doing well. He has progressed away from a walker with exercise and now ambulates with a cane.  Past Medical History  Diagnosis Date  . Macular degeneration   . History of prostate cancer   . Infection, bartonella   . Spinal stenosis   . Parkinson's disease 05-2012  . Cancer   . Hyperglycemia    Past Surgical History  Procedure Laterality Date  . Appendectomy    . Tonsillectomy    . Prostate surgery  1998    surgery and then radiation 7 years later in 2005  . Colonoscopy with propofol N/A 03/03/2013    Procedure: COLONOSCOPY WITH PROPOFOL;  Surgeon: Garlan Fair, MD;  Location: [facility];  Service: Endoscopy;  Laterality: N/A;   History   Social History  . Marital Status: Married    Spouse Name: N/A    Number of Children: N/A  . Years of Education: N/A   Occupational History  . Not on file.   Social History Main Topics  . Smoking status: Former Smoker -- 0.25 packs/day for 10 years    Quit date: 11/27/1970  . Smokeless tobacco: Never Used  . Alcohol Use: Yes  . Drug Use: No  . Sexual Activity: Yes    Partners: Female   Other Topics Concern  . Not on file   Social History Narrative  . No narrative on file   No family history on file. Current Outpatient Prescriptions on File Prior to Visit  Medication Sig Dispense Refill  . acetaminophen (TYLENOL) 500 MG tablet Take 500 mg by mouth every 6 (six) hours as needed for pain.      . beta carotene w/minerals (OCUVITE) tablet Take 1 tablet by mouth daily.      . hydroxypropyl methylcellulose (ISOPTO TEARS) 2.5 % ophthalmic solution Place 1 drop into both  eyes as needed (dry eyes).      . mirabegron ER (MYRBETRIQ) 25 MG TB24 Take 25 mg by mouth daily. Take 50 mg daily      . carbidopa-levodopa (SINEMET IR) 25-100 MG per tablet Take 1.5 tablets by mouth 3 (three) times daily.      . cephALEXin (KEFLEX) 500 MG capsule Take 1 capsule (500 mg total) by mouth 3 (three) times daily.  52 capsule  0  . ferrous sulfate 325 (65 FE) MG tablet Take 325 mg by mouth daily with breakfast.      . folic acid (FOLVITE) 160 MCG tablet Take 400 mcg by mouth daily.      . sildenafil (VIAGRA) 100 MG tablet Take 1 tablet (100 mg total) by mouth daily as needed for erectile dysfunction.  10 tablet  5   No current facility-administered medications on file prior to visit.   Allergies  Allergen Reactions  . Sulfa Antibiotics Itching and Rash  . Sulfa Drugs Cross Reactors Itching and Rash    Tribrisen, a sulfa potentiator, also causes reaction in patient, as does sulfa  . Ciprofloxacin    Immunization History  Administered Date(s) Administered  . Influenza, High Dose Seasonal PF 08/18/2013  . Pneumococcal Conjugate-13 12/19/2013  . Td 05/12/2004  .  Zoster 10/16/2007   Prior to Admission medications   Medication Sig Start Date End Date Taking? Authorizing Provider  acetaminophen (TYLENOL) 500 MG tablet Take 500 mg by mouth every 6 (six) hours as needed for pain.   Yes Historical Provider, MD  beta carotene w/minerals (OCUVITE) tablet Take 1 tablet by mouth daily.   Yes Historical Provider, MD  hydroxypropyl methylcellulose (ISOPTO TEARS) 2.5 % ophthalmic solution Place 1 drop into both eyes as needed (dry eyes).   Yes Historical Provider, MD  mirabegron ER (MYRBETRIQ) 25 MG TB24 Take 25 mg by mouth daily. Take 50 mg daily   Yes Historical Provider, MD  carbidopa-levodopa (SINEMET IR) 25-100 MG per tablet Take 1.5 tablets by mouth 3 (three) times daily.    Historical Provider, MD  cephALEXin (KEFLEX) 500 MG capsule Take 1 capsule (500 mg total) by mouth 3 (three)  times daily. 11/12/13   Shanda Howells, MD  etodolac (LODINE) 400 MG tablet  09/15/13   Historical Provider, MD  ferrous sulfate 325 (65 FE) MG tablet Take 325 mg by mouth daily with breakfast.    Historical Provider, MD  folic acid (FOLVITE) 076 MCG tablet Take 400 mcg by mouth daily.    Historical Provider, MD  sildenafil (VIAGRA) 100 MG tablet Take 1 tablet (100 mg total) by mouth daily as needed for erectile dysfunction. 07/31/13   Vernie Shanks, MD     ROS: As above in the HPI. All other systems are stable or negative.  OBJECTIVE: APPEARANCE:  Patient in no acute distress.The patient appeared well nourished and normally developed. Acyanotic. Waist: VITAL SIGNS:BP 122/72  Pulse 66  Temp(Src) 98.2 F (36.8 C) (Oral)  Ht 5' 5"  (1.651 m)  Wt 188 lb (85.276 kg)  BMI 31.28 kg/m2 WM, flat facies.  SKIN: warm and  Dry without overt rashes, tattoos and scars  HEAD and Neck: without JVD, Head and scalp: normal Eyes:No scleral icterus. Fundi normal, eye movements normal. Ears: Auricle normal, canal normal, Tympanic membranes normal, insufflation normal. Nose: normal Throat: normal Neck & thyroid: normal  CHEST & LUNGS: Chest wall: normal Lungs: Clear  CVS: Reveals the PMI to be normally located. Regular rhythm, First and Second Heart sounds are normal,  absence of murmurs, rubs or gallops. Peripheral vasculature: Radial pulses: normal Dorsal pedis pulses: normal Posterior pulses: normal  ABDOMEN:  Appearance: normal Benign, no organomegaly, no masses, no Abdominal Aortic enlargement. No Guarding , no rebound. No Bruits. Bowel sounds: normal  RECTAL: N/A GU: N/A  EXTREMETIES: nonedematous.  MUSCULOSKELETAL:  Spine: reduced ROM Joints: intact Ambulates with a cane.  NEUROLOGIC: oriented to time,place and person; nonfocal. Features of parkinson's disease in the extremities. Cogwheel stiffness and a hesitancy in gait.  ASSESSMENT: Spinal stenosis  Peripheral  neuropathy, chronic. - Plan: BMP8+EGFR, Folate  Parkinson's disease - Plan: BMP8+EGFR  Macular degeneration  Lower extremity weakness in a patient with a history of chronic peripheral neuropathy and mild lumbar spinal stenosis as well as Bartonella infection  PVC (premature ventricular contraction)  Infection, bartonella  Hyperglycemia - Plan: BMP8+EGFR  History of prostate cancer  Encephalopathy acute, toxic and metabolic  UTI (urinary tract infection) - Plan: POCT CBC, POCT UA - Microscopic Only, POCT urinalysis dipstick, Urine culture  Thrombocytopenia, unspecified - Plan: POCT CBC, Folate  Need for pneumococcal vaccination - Plan: pneumococcal 13-valent conjugate vaccine (PREVNAR 13) injection 0.5 mL  PLAN: Continue present care.  Orders Placed This Encounter  Procedures  . Urine culture  . BMP8+EGFR  .  Folate  . POCT CBC  . POCT UA - Microscopic Only  . POCT urinalysis dipstick   Meds ordered this encounter  Medications  . etodolac (LODINE) 400 MG tablet    Sig:   . pneumococcal 13-valent conjugate vaccine (PREVNAR 13) injection 0.5 mL    Sig:    There are no discontinued medications. Return in about 3 months (around 03/19/2014) for Recheck medical problems.  Keylie Beavers P. Jacelyn Grip, M.D.

## 2013-12-20 LAB — BMP8+EGFR
BUN/Creatinine Ratio: 24 — ABNORMAL HIGH (ref 10–22)
BUN: 20 mg/dL (ref 8–27)
CO2: 26 mmol/L (ref 18–29)
Calcium: 8.7 mg/dL (ref 8.6–10.2)
Chloride: 101 mmol/L (ref 97–108)
Creatinine, Ser: 0.82 mg/dL (ref 0.76–1.27)
GFR calc Af Amer: 95 mL/min/{1.73_m2} (ref >59)
GFR calc non Af Amer: 82 mL/min/{1.73_m2} (ref >59)
Glucose: 101 mg/dL — ABNORMAL HIGH (ref 65–99)
Potassium: 4.5 mmol/L (ref 3.5–5.2)
Sodium: 143 mmol/L (ref 134–144)

## 2013-12-20 LAB — FOLATE: Folate: 17 ng/mL (ref >3.0)

## 2013-12-21 LAB — URINE CULTURE

## 2014-02-18 ENCOUNTER — Ambulatory Visit: Payer: Medicare Other | Attending: Neurology

## 2014-02-18 DIAGNOSIS — IMO0001 Reserved for inherently not codable concepts without codable children: Secondary | ICD-10-CM | POA: Insufficient documentation

## 2014-02-18 DIAGNOSIS — G20 Parkinson's disease: Secondary | ICD-10-CM | POA: Insufficient documentation

## 2014-02-18 DIAGNOSIS — R471 Dysarthria and anarthria: Secondary | ICD-10-CM | POA: Insufficient documentation

## 2014-02-18 DIAGNOSIS — G20A1 Parkinson's disease without dyskinesia, without mention of fluctuations: Secondary | ICD-10-CM | POA: Insufficient documentation

## 2014-02-20 ENCOUNTER — Ambulatory Visit: Payer: Medicare Other

## 2014-02-23 ENCOUNTER — Ambulatory Visit: Payer: Medicare Other

## 2014-02-25 ENCOUNTER — Telehealth: Payer: Self-pay | Admitting: Family Medicine

## 2014-02-25 DIAGNOSIS — N390 Urinary tract infection, site not specified: Secondary | ICD-10-CM

## 2014-02-25 NOTE — Telephone Encounter (Signed)
Patient aware.

## 2014-02-25 NOTE — Telephone Encounter (Signed)
Please work patient in to give [place] a urine sample for UA and UCx. And a brief visit.

## 2014-02-25 NOTE — Telephone Encounter (Signed)
Pt to leave a sterile UA sample on Thursday for wong to read No avail openings for him to be seen

## 2014-02-26 ENCOUNTER — Telehealth: Payer: Self-pay | Admitting: Family Medicine

## 2014-02-26 ENCOUNTER — Other Ambulatory Visit (INDEPENDENT_AMBULATORY_CARE_PROVIDER_SITE_OTHER): Payer: Medicare Other

## 2014-02-26 ENCOUNTER — Other Ambulatory Visit: Payer: Self-pay | Admitting: Family Medicine

## 2014-02-26 DIAGNOSIS — N390 Urinary tract infection, site not specified: Secondary | ICD-10-CM

## 2014-02-26 LAB — POCT URINALYSIS DIPSTICK
Bilirubin, UA: NEGATIVE
Glucose, UA: NEGATIVE
Ketones, UA: NEGATIVE
Nitrite, UA: POSITIVE
Protein, UA: NEGATIVE
Spec Grav, UA: 1.02
Urobilinogen, UA: NEGATIVE
pH, UA: 6

## 2014-02-26 MED ORDER — DOXYCYCLINE HYCLATE 100 MG PO TABS: 100.0000 mg | ORAL_TABLET | Freq: Two times a day (BID) | ORAL | Status: DC

## 2014-02-26 NOTE — Progress Notes (Signed)
Patient came in for labs only.

## 2014-02-26 NOTE — Telephone Encounter (Signed)
Had a UTI couple weeks ago treated with doxycycline for 7 days. Relapsed.  Prior to this was treated in the ED for pneumonia and UTI.  Brought Urine in for analysis c/w UTI Will send sample out for UCx and Rx an antibiotic. Doxycycline 100 mg bid for 14 days with a refill. Follow up as planned. Mccartney Chuba P. Jacelyn Grip, M.D.

## 2014-02-26 NOTE — Addendum Note (Signed)
Addended by: Earlene Plater on: 02/26/2014 02:29 PM   Modules accepted: Orders

## 2014-02-28 LAB — URINE CULTURE

## 2014-03-01 ENCOUNTER — Other Ambulatory Visit: Payer: Self-pay | Admitting: Family Medicine

## 2014-03-01 MED ORDER — CEFDINIR 300 MG PO CAPS: 300.0000 mg | ORAL_CAPSULE | Freq: Two times a day (BID) | ORAL | Status: DC

## 2014-03-01 NOTE — Progress Notes (Signed)
Quick Note:  Call Patient Labs that are abnormal: The urine culture shows a resistant E Coli to the doxycycline/tetracycline. Will need to change antibiotic.to omnicef 300 mg bid for 10 days. Ordered in EPIC.  The rest are at goal  Recommendations:    ______

## 2014-03-02 ENCOUNTER — Ambulatory Visit: Payer: Medicare Other | Attending: Neurology

## 2014-03-02 DIAGNOSIS — G20 Parkinson's disease: Secondary | ICD-10-CM | POA: Insufficient documentation

## 2014-03-02 DIAGNOSIS — G20A1 Parkinson's disease without dyskinesia, without mention of fluctuations: Secondary | ICD-10-CM | POA: Insufficient documentation

## 2014-03-02 DIAGNOSIS — R471 Dysarthria and anarthria: Secondary | ICD-10-CM | POA: Insufficient documentation

## 2014-03-02 DIAGNOSIS — IMO0001 Reserved for inherently not codable concepts without codable children: Secondary | ICD-10-CM | POA: Insufficient documentation

## 2014-03-05 ENCOUNTER — Ambulatory Visit: Payer: Medicare Other

## 2014-03-11 ENCOUNTER — Ambulatory Visit: Payer: Medicare Other | Admitting: Occupational Therapy

## 2014-03-11 ENCOUNTER — Ambulatory Visit: Payer: Medicare Other

## 2014-03-13 ENCOUNTER — Ambulatory Visit: Payer: Medicare Other

## 2014-03-17 ENCOUNTER — Encounter: Payer: Medicare Other | Admitting: Speech Pathology

## 2014-03-17 ENCOUNTER — Encounter: Payer: Medicare Other | Admitting: Occupational Therapy

## 2014-03-19 ENCOUNTER — Ambulatory Visit (INDEPENDENT_AMBULATORY_CARE_PROVIDER_SITE_OTHER): Payer: Medicare Other

## 2014-03-19 ENCOUNTER — Ambulatory Visit: Payer: Medicare Other

## 2014-03-19 ENCOUNTER — Encounter: Payer: Self-pay | Admitting: Family Medicine

## 2014-03-19 ENCOUNTER — Ambulatory Visit (INDEPENDENT_AMBULATORY_CARE_PROVIDER_SITE_OTHER): Payer: Medicare Other | Admitting: Family Medicine

## 2014-03-19 VITALS — BP 165/82 | HR 55 | Temp 98.0°F | Ht 69.0 in

## 2014-03-19 DIAGNOSIS — R739 Hyperglycemia, unspecified: Secondary | ICD-10-CM

## 2014-03-19 DIAGNOSIS — G609 Hereditary and idiopathic neuropathy, unspecified: Secondary | ICD-10-CM

## 2014-03-19 DIAGNOSIS — I493 Ventricular premature depolarization: Secondary | ICD-10-CM

## 2014-03-19 DIAGNOSIS — G629 Polyneuropathy, unspecified: Secondary | ICD-10-CM

## 2014-03-19 DIAGNOSIS — G9340 Encephalopathy, unspecified: Secondary | ICD-10-CM

## 2014-03-19 DIAGNOSIS — R29898 Other symptoms and signs involving the musculoskeletal system: Secondary | ICD-10-CM

## 2014-03-19 DIAGNOSIS — M40209 Unspecified kyphosis, site unspecified: Secondary | ICD-10-CM

## 2014-03-19 DIAGNOSIS — M4000 Postural kyphosis, site unspecified: Secondary | ICD-10-CM

## 2014-03-19 DIAGNOSIS — R7309 Other abnormal glucose: Secondary | ICD-10-CM

## 2014-03-19 DIAGNOSIS — Z8546 Personal history of malignant neoplasm of prostate: Secondary | ICD-10-CM

## 2014-03-19 DIAGNOSIS — I4949 Other premature depolarization: Secondary | ICD-10-CM

## 2014-03-19 DIAGNOSIS — H3530 Unspecified macular degeneration: Secondary | ICD-10-CM

## 2014-03-19 DIAGNOSIS — R350 Frequency of micturition: Secondary | ICD-10-CM

## 2014-03-19 DIAGNOSIS — G20A1 Parkinson's disease without dyskinesia, without mention of fluctuations: Secondary | ICD-10-CM

## 2014-03-19 DIAGNOSIS — IMO0001 Reserved for inherently not codable concepts without codable children: Secondary | ICD-10-CM

## 2014-03-19 DIAGNOSIS — A449 Bartonellosis, unspecified: Secondary | ICD-10-CM

## 2014-03-19 DIAGNOSIS — G20 Parkinson's disease: Secondary | ICD-10-CM

## 2014-03-19 LAB — POCT UA - MICROSCOPIC ONLY
Bacteria, U Microscopic: NEGATIVE
Casts, Ur, LPF, POC: NEGATIVE
Crystals, Ur, HPF, POC: NEGATIVE
Mucus, UA: NEGATIVE
RBC, urine, microscopic: NEGATIVE
WBC, Ur, HPF, POC: NEGATIVE
Yeast, UA: NEGATIVE

## 2014-03-19 LAB — POCT URINALYSIS DIPSTICK
Bilirubin, UA: NEGATIVE
Blood, UA: NEGATIVE
Glucose, UA: NEGATIVE
Ketones, UA: NEGATIVE
Leukocytes, UA: NEGATIVE
Nitrite, UA: NEGATIVE
Protein, UA: NEGATIVE
Spec Grav, UA: 1.015
Urobilinogen, UA: NEGATIVE
pH, UA: 6.5

## 2014-03-19 MED ORDER — TADALAFIL 5 MG PO TABS: 5.0000 mg | ORAL_TABLET | Freq: Every day | ORAL | Status: DC | PRN

## 2014-03-19 NOTE — Progress Notes (Signed)
Patient ID: Raymond Valdez, male   DOB: [birth year], 80 y.o.   MRN: [id number] SUBJECTIVE: CC: Chief Complaint  Patient presents with  . Follow-up    3 MONTH FOLLOW UP CHRONIC PROBLEMS FOLLOW UP URINALYSIS COMPLETED OMICEF MONDAY    HPI: Came for follow up : 1. UTI resolved. Gave a urine sample.  2. parkinsons disease/ now on long acting/slow release sinemet.  3. ED has used Viagra with no success and wants to use cialis daily for BPH symptoms and ED.  4. Concerned about his kyphosis getting worse. Thinks his ligamentum nuchae snaps at times. 5.Macular degeneration with slowly deteriorating vision.  Past Medical History  Diagnosis Date  . Macular degeneration   . History of prostate cancer   . Infection, bartonella   . Spinal stenosis   . Parkinson's disease 05-2012  . Cancer   . Hyperglycemia    Past Surgical History  Procedure Laterality Date  . Appendectomy    . Tonsillectomy    . Prostate surgery  1998    surgery and then radiation 7 years later in 2005  . Colonoscopy with propofol N/A 03/03/2013    Procedure: COLONOSCOPY WITH PROPOFOL;  Surgeon: Garlan Fair, MD;  Location: [facility];  Service: Endoscopy;  Laterality: N/A;   History   Social History  . Marital Status: Married    Spouse Name: N/A    Number of Children: N/A  . Years of Education: N/A   Occupational History  . Not on file.   Social History Main Topics  . Smoking status: Former Smoker -- 0.25 packs/day for 10 years    Quit date: 11/27/1970  . Smokeless tobacco: Never Used  . Alcohol Use: Yes  . Drug Use: No  . Sexual Activity: Yes    Partners: Female   Other Topics Concern  . Not on file   Social History Narrative  . No narrative on file   No family history on file. Current Outpatient Prescriptions on File Prior to Visit  Medication Sig Dispense Refill  . acetaminophen (TYLENOL) 500 MG tablet Take 500 mg by mouth every 6 (six) hours as needed for pain.      . beta carotene  w/minerals (OCUVITE) tablet Take 1 tablet by mouth daily.      . carbidopa-levodopa (SINEMET IR) 25-100 MG per tablet Take 1.5 tablets by mouth 3 (three) times daily.      Marland Kitchen etodolac (LODINE) 400 MG tablet       . ferrous sulfate 325 (65 FE) MG tablet Take 325 mg by mouth daily with breakfast.      . folic acid (FOLVITE) 580 MCG tablet Take 400 mcg by mouth daily.      . hydroxypropyl methylcellulose (ISOPTO TEARS) 2.5 % ophthalmic solution Place 1 drop into both eyes as needed (dry eyes).      . mirabegron ER (MYRBETRIQ) 25 MG TB24 Take 25 mg by mouth daily. Take 50 mg daily      . sildenafil (VIAGRA) 100 MG tablet Take 1 tablet (100 mg total) by mouth daily as needed for erectile dysfunction.  10 tablet  5   Current Facility-Administered Medications on File Prior to Visit  Medication Dose Route Frequency Provider Last Rate Last Dose  . pneumococcal 13-valent conjugate vaccine (PREVNAR 13) injection 0.5 mL  0.5 mL Intramuscular Once Vernie Shanks, MD       Allergies  Allergen Reactions  . Sulfa Antibiotics Itching and Rash  . Sulfa Drugs Cross Reactors Itching  and Rash    Tribrisen, a sulfa potentiator, also causes reaction in patient, as does sulfa  . Ciprofloxacin    Immunization History  Administered Date(s) Administered  . Influenza, High Dose Seasonal PF 08/18/2013  . Pneumococcal Conjugate-13 12/19/2013  . Td 05/12/2004  . Zoster 10/16/2007   Prior to Admission medications   Medication Sig Start Date End Date Taking? Authorizing Provider  acetaminophen (TYLENOL) 500 MG tablet Take 500 mg by mouth every 6 (six) hours as needed for pain.   Yes Historical Provider, MD  beta carotene w/minerals (OCUVITE) tablet Take 1 tablet by mouth daily.   Yes Historical Provider, MD  carbidopa-levodopa (SINEMET IR) 25-100 MG per tablet Take 1.5 tablets by mouth 3 (three) times daily.   Yes Historical Provider, MD  cephALEXin (KEFLEX) 500 MG capsule Take 1 capsule (500 mg total) by mouth 3  (three) times daily. 11/12/13  Yes Shanda Howells, MD  etodolac (LODINE) 400 MG tablet  09/15/13  Yes Historical Provider, MD  ferrous sulfate 325 (65 FE) MG tablet Take 325 mg by mouth daily with breakfast.   Yes Historical Provider, MD  folic acid (FOLVITE) 433 MCG tablet Take 400 mcg by mouth daily.   Yes Historical Provider, MD  hydrocortisone 2.5 % cream  03/16/14  Yes Historical Provider, MD  hydroxypropyl methylcellulose (ISOPTO TEARS) 2.5 % ophthalmic solution Place 1 drop into both eyes as needed (dry eyes).   Yes Historical Provider, MD  mirabegron ER (MYRBETRIQ) 25 MG TB24 Take 25 mg by mouth daily. Take 50 mg daily   Yes Historical Provider, MD  PROAIR HFA 108 (90 BASE) MCG/ACT inhaler  01/26/14  Yes Historical Provider, MD  sildenafil (VIAGRA) 100 MG tablet Take 1 tablet (100 mg total) by mouth daily as needed for erectile dysfunction. 07/31/13  Yes Vernie Shanks, MD  cefdinir (OMNICEF) 300 MG capsule Take 1 capsule (300 mg total) by mouth 2 (two) times daily. 03/01/14   Vernie Shanks, MD  doxycycline (VIBRA-TABS) 100 MG tablet Take 1 tablet (100 mg total) by mouth 2 (two) times daily. 02/26/14   Vernie Shanks, MD     ROS: As above in the HPI. All other systems are stable or negative.  OBJECTIVE: APPEARANCE:  Patient in no acute distress.The patient appeared well nourished and normally developed. Acyanotic. Waist: VITAL SIGNS:BP 165/82  Pulse 55  Temp(Src) 98 F (36.7 C) (Oral)  Ht 5\' 9"  (1.753 m) WM Elderly Overweight Kyphotic   SKIN: warm and  Dry without overt rashes, tattoos and scars  HEAD and Neck: without JVD, Head and scalp: normal Eyes:No scleral icterus. Fundi normal, eye movements normal. Ears: Auricle normal, canal normal, Tympanic membranes normal, insufflation normal. Nose: normal Throat: normal Neck & thyroid: normal  CHEST & LUNGS: Chest wall: normal Lungs: Clear  CVS: Reveals the PMI to be normally located. Regular rhythm, First and Second Heart  sounds are normal,  absence of murmurs, rubs or gallops. Peripheral vasculature: Radial pulses: normal Dorsal pedis pulses: normal Posterior pulses: normal  ABDOMEN:  Appearance: normal Benign, no organomegaly, no masses, no Abdominal Aortic enlargement. No Guarding , no rebound. No Bruits. Bowel sounds: normal  RECTAL: N/A GU: N/A  EXTREMETIES: nonedematous.  MUSCULOSKELETAL:  Spine: marked kyphosis. nontender   NEUROLOGIC: oriented to time,place and person; nonfocal. Stiffness and parkinson's associated findings of flat facies. And shuffling gait.   ASSESSMENT: Frequency - Plan: POCT urinalysis dipstick, POCT UA - Microscopic Only  Peripheral neuropathy, chronic.  Parkinson's disease  Macular  degeneration  Lower extremity weakness in a patient with a history of chronic peripheral neuropathy and mild lumbar spinal stenosis as well as Bartonella infection  PVC (premature ventricular contraction)  Infection, bartonella  Hyperglycemia  Encephalopathy acute, toxic and metabolic  History of prostate cancer  Kyphosis - Plan: DG Cervical Spine Complete, DG Thoracic Spine 2 View, CANCELED: DG Thoracic Spine W/Swimmers  PLAN:  Orders Placed This Encounter  Procedures  . DG Cervical Spine Complete    Standing Status: Future     Number of Occurrences: 1     Standing Expiration Date: 05/20/2015    Order Specific Question:  Reason for Exam (SYMPTOM  OR DIAGNOSIS REQUIRED)    Answer:  kyphosis    Order Specific Question:  Preferred imaging location?    Answer:  Internal  . DG Thoracic Spine 2 View    Standing Status: Future     Number of Occurrences: 1     Standing Expiration Date: 05/20/2015    Order Specific Question:  Reason for Exam (SYMPTOM  OR DIAGNOSIS REQUIRED)    Answer:  kyphosis suspect vertebral fracture    Order Specific Question:  Preferred imaging location?    Answer:  Internal  . POCT urinalysis dipstick  . POCT UA - Microscopic Only  [facility]  reading (PRIMARY) by  Dr. Esmond Camper and degenerative changes of the cervical and thoracic spine. No obvious fractures.                            Results for orders placed in visit on 03/19/14  POCT URINALYSIS DIPSTICK      Result Value Ref Range   Color, UA yellow     Clarity, UA clear     Glucose, UA neg     Bilirubin, UA neg     Ketones, UA neg     Spec Grav, UA 1.015     Blood, UA neg     pH, UA 6.5     Protein, UA neg     Urobilinogen, UA negative     Nitrite, UA neg     Leukocytes, UA Negative    POCT UA - MICROSCOPIC ONLY      Result Value Ref Range   WBC, Ur, HPF, POC neg     RBC, urine, microscopic neg     Bacteria, U Microscopic neg     Mucus, UA neg     Epithelial cells, urine per micros occ     Crystals, Ur, HPF, POC neg     Casts, Ur, LPF, POC neg     Yeast, UA neg     Discussed the risks associated with cialis and that family of medications.howver his concern and needs in his eyes outweigh the risks . He was made  Aware of the visual risks and the CAD risks. No further antibiotics needed at this time.  Meds ordered this encounter  Medications  . hydrocortisone 2.5 % cream    Sig:   . PROAIR HFA 108 (90 BASE) MCG/ACT inhaler    Sig:   . tadalafil (CIALIS) 5 MG tablet    Sig: Take 1 tablet (5 mg total) by mouth daily as needed for erectile dysfunction.    Dispense:  30 tablet    Refill:  3   Medications Discontinued During This Encounter  Medication Reason  . doxycycline (VIBRA-TABS) 100 MG tablet Completed Course  . cephALEXin (KEFLEX) 500 MG capsule Completed Course  . cefdinir (  OMNICEF) 300 MG capsule Completed Course   Return in about 3 months (around 06/18/2014).  Waller Marcussen P. Jacelyn Grip, M.D.

## 2014-03-20 ENCOUNTER — Ambulatory Visit: Payer: Medicare Other

## 2014-03-20 ENCOUNTER — Encounter: Payer: Medicare Other | Admitting: Occupational Therapy

## 2014-03-20 ENCOUNTER — Other Ambulatory Visit: Payer: Self-pay | Admitting: *Deleted

## 2014-03-22 NOTE — Progress Notes (Signed)
Quick Note:  Call patient. Labs normal.urine. No change in plan. ______

## 2014-03-23 ENCOUNTER — Telehealth: Payer: Self-pay | Admitting: Family Medicine

## 2014-03-24 ENCOUNTER — Encounter: Payer: Medicare Other | Admitting: Occupational Therapy

## 2014-03-24 ENCOUNTER — Ambulatory Visit: Payer: Medicare Other

## 2014-03-24 NOTE — Telephone Encounter (Signed)
Can't fax it because it voids the prescription. There is a watermark on the prescription paper. Prescription was received electronically by the pharmacy. We received a confirmation receipt.  If an electronic prescription will not work he can pick up a printed prescription. Left message for them to give [place] a call back with preference.

## 2014-03-26 ENCOUNTER — Telehealth: Payer: Self-pay | Admitting: *Deleted

## 2014-03-26 NOTE — Telephone Encounter (Signed)
Aware. Sample at front. Does not want script for now.

## 2014-03-26 NOTE — Telephone Encounter (Signed)
Ins co denied cialis saying he had not tried and failed a formulary drug, they did not expand on what the formulary drugs are.Marland Kitchen

## 2014-03-26 NOTE — Telephone Encounter (Signed)
Have him request the formulary from the insurance comp[any and I have samples he can try in the meantime.

## 2014-03-26 NOTE — Telephone Encounter (Signed)
Samples provided.  Documented in other encounter.

## 2014-03-27 ENCOUNTER — Telehealth: Payer: Self-pay | Admitting: Family Medicine

## 2014-03-27 ENCOUNTER — Ambulatory Visit: Payer: Medicare Other | Attending: Neurology

## 2014-03-27 ENCOUNTER — Encounter: Payer: Medicare Other | Admitting: Occupational Therapy

## 2014-03-27 DIAGNOSIS — R471 Dysarthria and anarthria: Secondary | ICD-10-CM | POA: Insufficient documentation

## 2014-03-27 DIAGNOSIS — G20 Parkinson's disease: Secondary | ICD-10-CM | POA: Insufficient documentation

## 2014-03-27 DIAGNOSIS — G20A1 Parkinson's disease without dyskinesia, without mention of fluctuations: Secondary | ICD-10-CM | POA: Insufficient documentation

## 2014-03-27 DIAGNOSIS — IMO0001 Reserved for inherently not codable concepts without codable children: Secondary | ICD-10-CM | POA: Insufficient documentation

## 2014-03-28 ENCOUNTER — Other Ambulatory Visit: Payer: Self-pay | Admitting: General Practice

## 2014-03-28 DIAGNOSIS — R300 Dysuria: Secondary | ICD-10-CM

## 2014-03-28 DIAGNOSIS — IMO0001 Reserved for inherently not codable concepts without codable children: Secondary | ICD-10-CM

## 2014-03-28 MED ORDER — CEPHALEXIN 500 MG PO CAPS: 500.0000 mg | ORAL_CAPSULE | Freq: Two times a day (BID) | ORAL | Status: DC

## 2014-03-30 ENCOUNTER — Other Ambulatory Visit: Payer: Self-pay | Admitting: Family Medicine

## 2014-03-30 MED ORDER — CEFDINIR 300 MG PO CAPS: 300.0000 mg | ORAL_CAPSULE | Freq: Two times a day (BID) | ORAL | Status: DC

## 2014-03-30 NOTE — Telephone Encounter (Signed)
Call patient : Prescription refilled & sent to pharmacy in [place]. 

## 2014-03-31 ENCOUNTER — Ambulatory Visit: Payer: Medicare Other

## 2014-03-31 ENCOUNTER — Encounter: Payer: Medicare Other | Admitting: Occupational Therapy

## 2014-04-02 ENCOUNTER — Ambulatory Visit: Payer: Medicare Other

## 2014-04-07 ENCOUNTER — Encounter: Payer: Medicare Other | Admitting: Occupational Therapy

## 2014-11-08 IMAGING — CR DG CERVICAL SPINE COMPLETE 4+V
4 series · 4 of 4 positions shown · non-contrast
Comparison: Cervical spine MRI 06/12/2012

CLINICAL DATA: Kyphosis.

EXAM:
CERVICAL SPINE  4+ VIEWS

[view not recorded (1 of 4)]
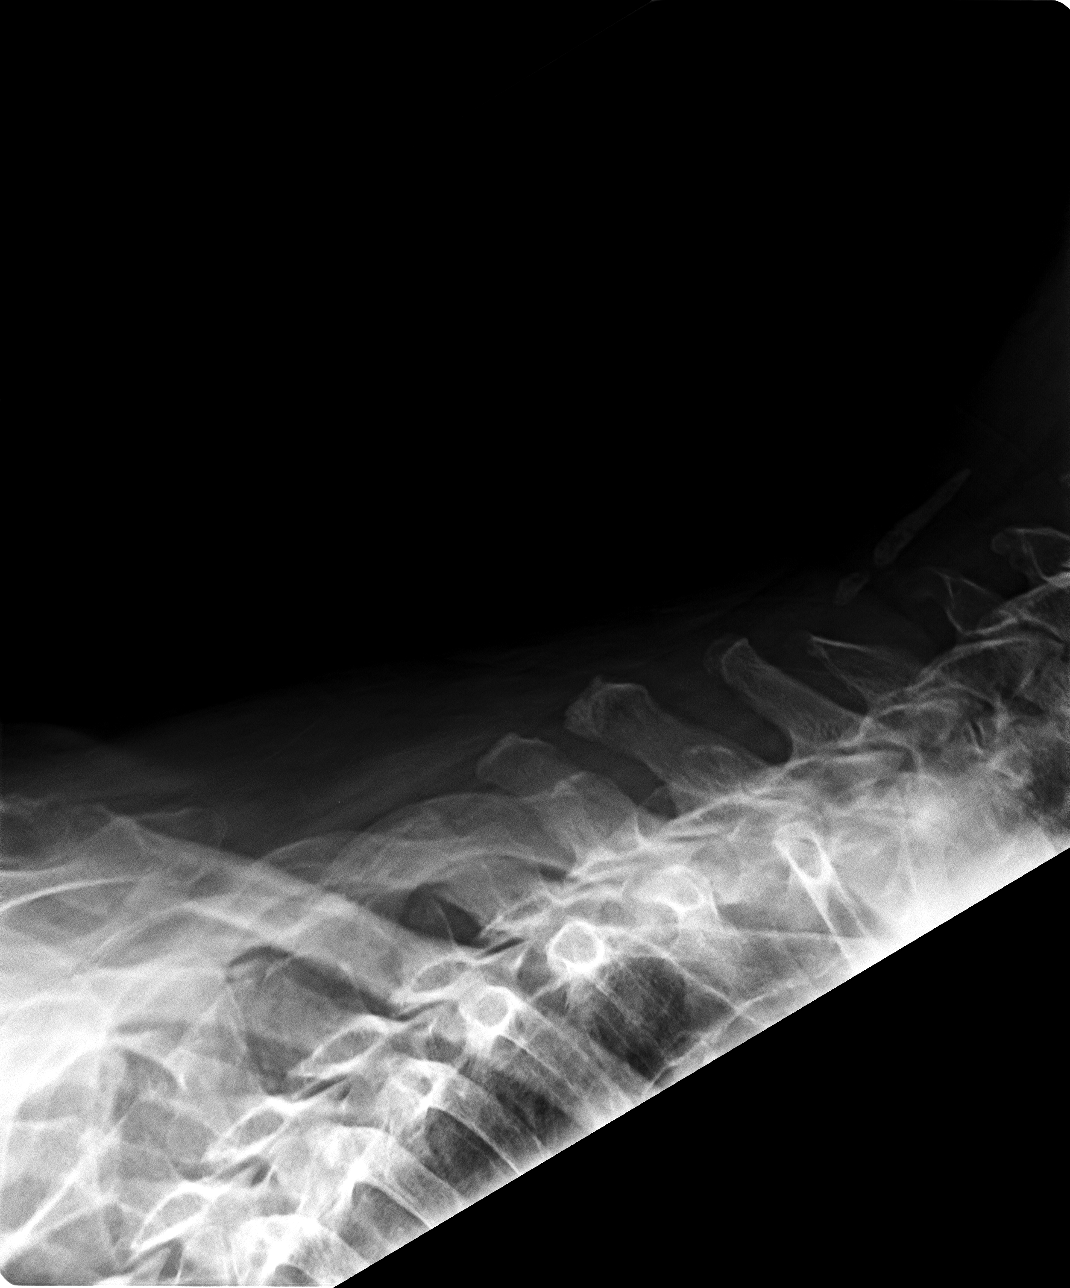

[view not recorded (2 of 4)]
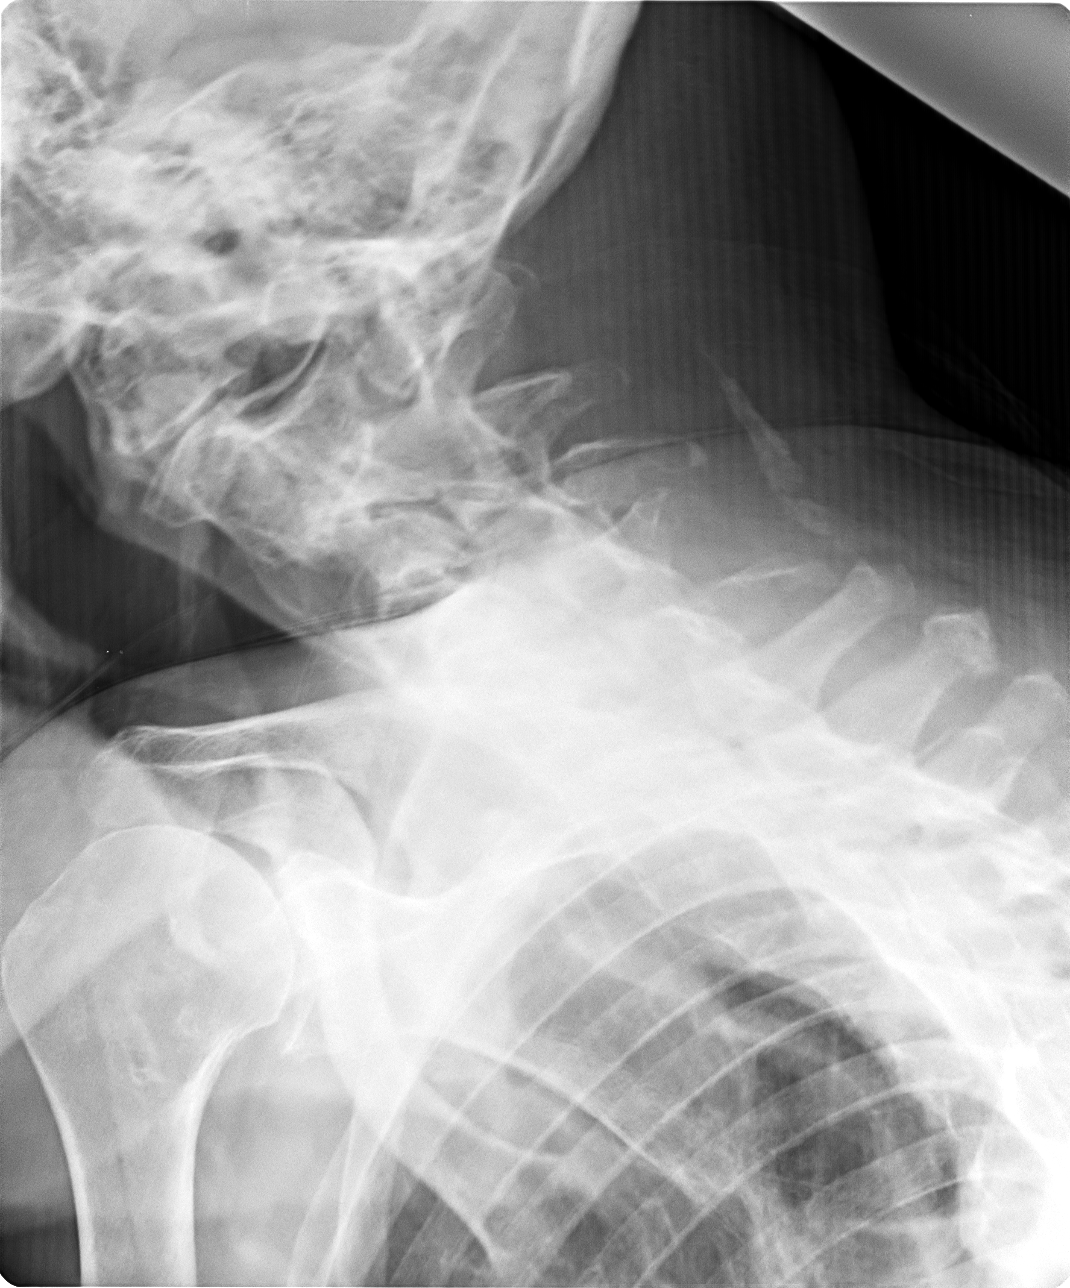

[view not recorded (3 of 4)]
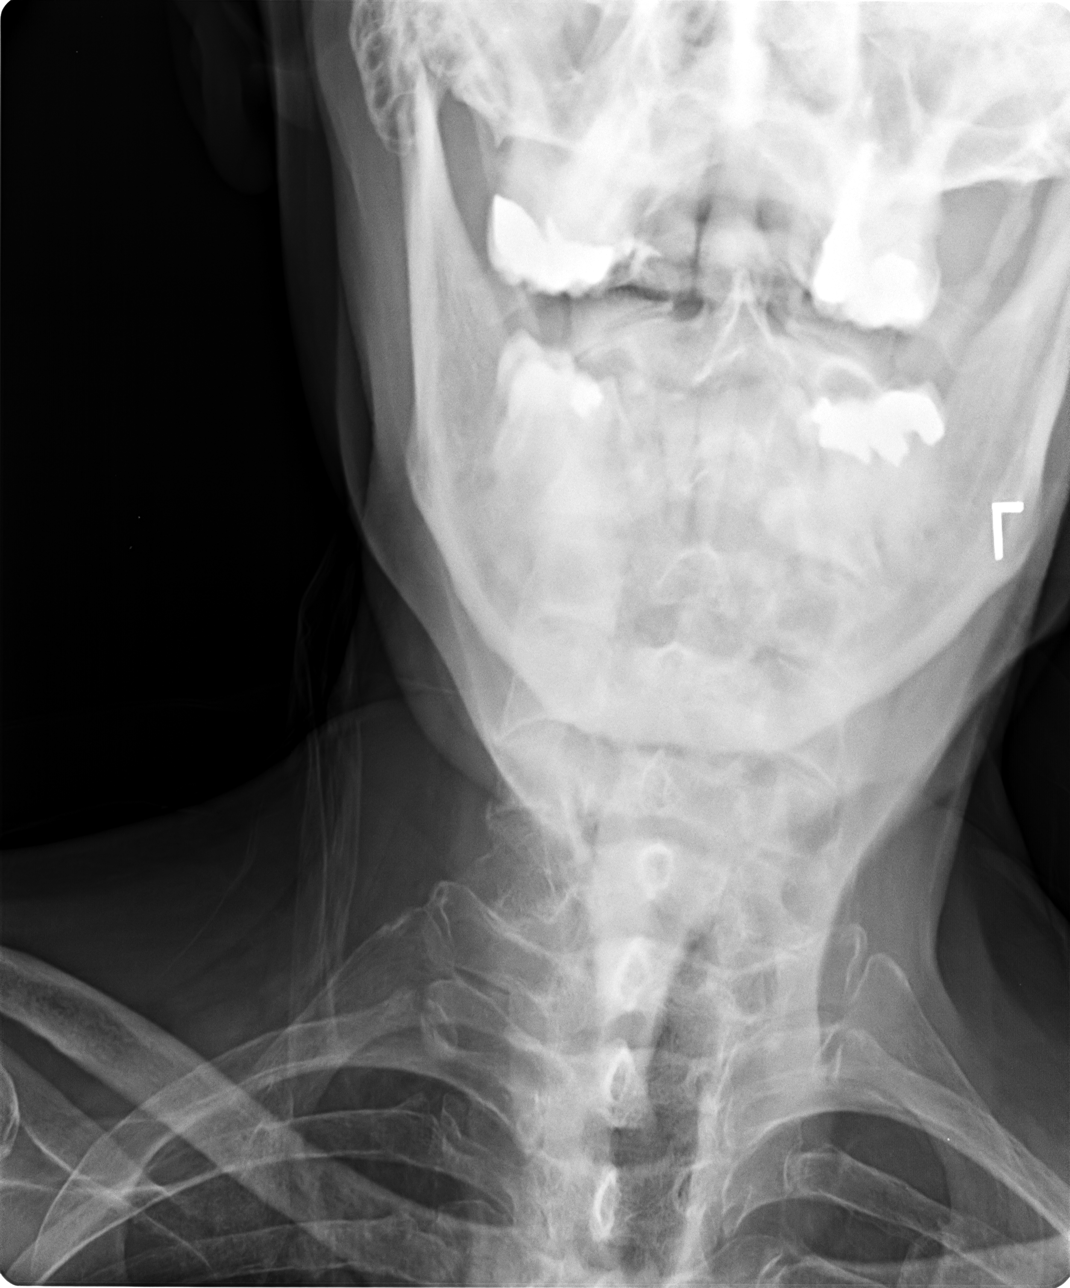

[view not recorded (4 of 4)]
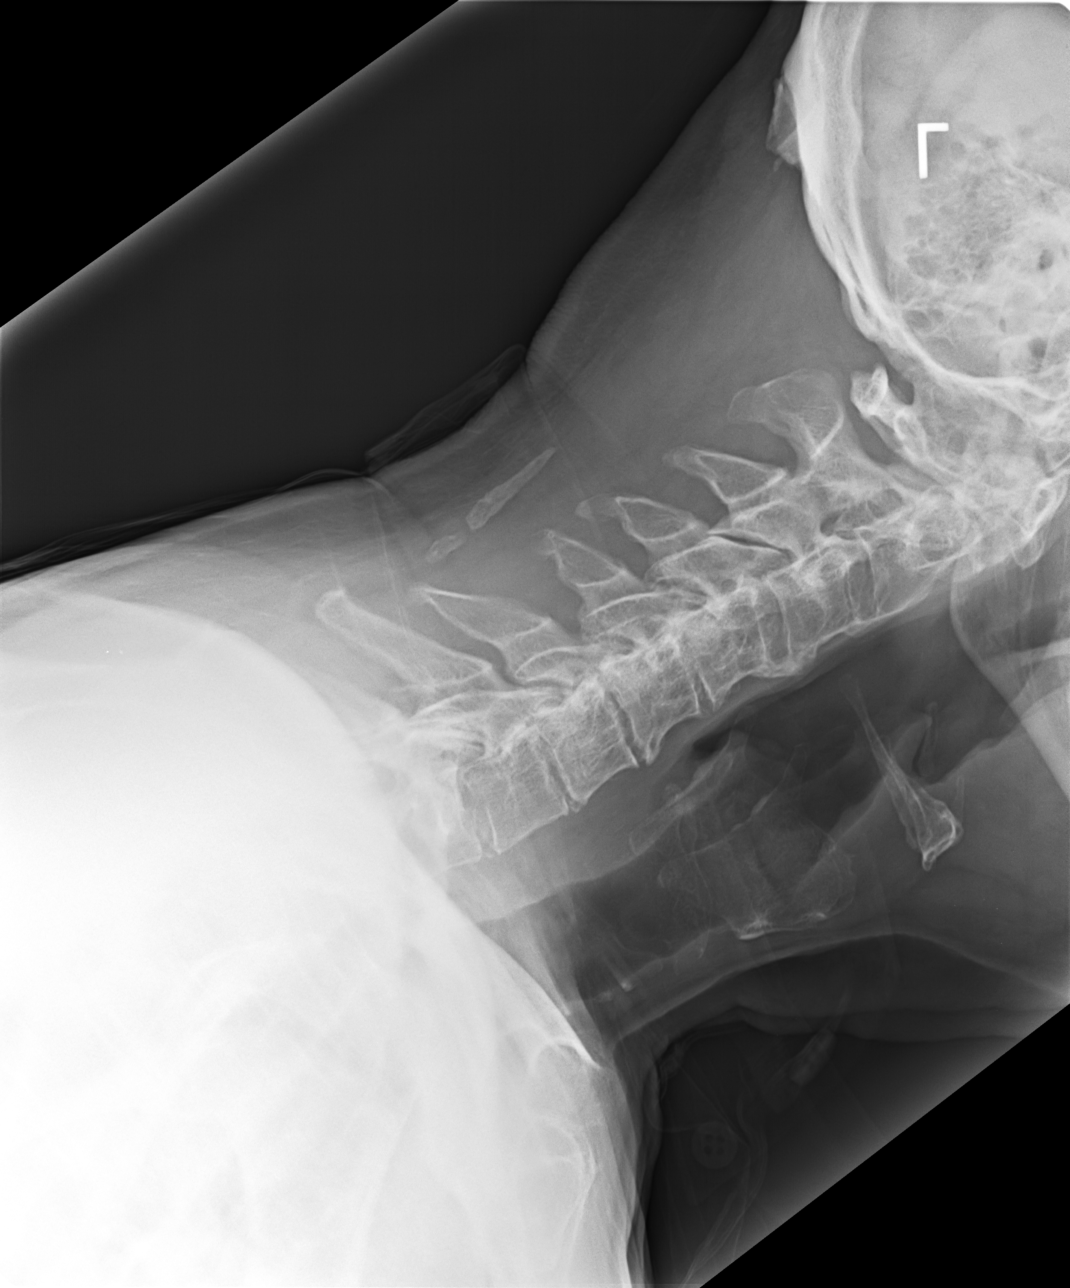

[4 of 4 positions shown; findings below may reference images not displayed]

FINDINGS: Examination is somewhat limited by patient positioning due to
thoracic kyphosis. There is straightening of the cervical spine.
Facet ankylosis is present at C2-3. Vertebral body heights are
preserved. Prevertebral soft tissues are within normal limits.
Moderate disc space narrowing is present from C4-5 to C6-7. Bridging
anterior osteophytosis is present at C3-4. Ossification is noted in
the nuchal ligament.
IMPRESSION: Multilevel degenerative disc disease without acute osseous
abnormality identified in the cervical spine. Suboptimal examination
due to thoracic kyphosis.

## 2014-12-01 ENCOUNTER — Ambulatory Visit
Admission: RE | Admit: 2014-12-01 | Discharge: 2014-12-01 | Disposition: A | Discharge status: Home / Self Care | Payer: Medicare Other | Source: Ambulatory Visit | Attending: Family Medicine | Admitting: Family Medicine

## 2014-12-01 ENCOUNTER — Other Ambulatory Visit: Payer: Self-pay | Admitting: Family Medicine

## 2014-12-01 DIAGNOSIS — R059 Cough, unspecified: Secondary | ICD-10-CM

## 2014-12-01 DIAGNOSIS — R05 Cough: Secondary | ICD-10-CM

## 2014-12-01 DIAGNOSIS — R4182 Altered mental status, unspecified: Secondary | ICD-10-CM

## 2015-01-05 ENCOUNTER — Encounter (HOSPITAL_COMMUNITY): Payer: Self-pay | Admitting: *Deleted

## 2015-01-05 ENCOUNTER — Emergency Department (HOSPITAL_COMMUNITY)
Admission: EM | Admit: 2015-01-05 | Discharge: 2015-01-05 | Disposition: A | Discharge status: Home / Self Care | Payer: Medicare Other | Attending: Emergency Medicine | Admitting: Emergency Medicine

## 2015-01-05 ENCOUNTER — Emergency Department (HOSPITAL_COMMUNITY): Payer: Medicare Other

## 2015-01-05 DIAGNOSIS — G20 Parkinson's disease: Secondary | ICD-10-CM | POA: Diagnosis not present

## 2015-01-05 DIAGNOSIS — Z87891 Personal history of nicotine dependence: Secondary | ICD-10-CM | POA: Insufficient documentation

## 2015-01-05 DIAGNOSIS — M7989 Other specified soft tissue disorders: Secondary | ICD-10-CM | POA: Diagnosis not present

## 2015-01-05 DIAGNOSIS — Z8546 Personal history of malignant neoplasm of prostate: Secondary | ICD-10-CM | POA: Diagnosis not present

## 2015-01-05 DIAGNOSIS — Z792 Long term (current) use of antibiotics: Secondary | ICD-10-CM | POA: Diagnosis not present

## 2015-01-05 DIAGNOSIS — Z79899 Other long term (current) drug therapy: Secondary | ICD-10-CM | POA: Diagnosis not present

## 2015-01-05 DIAGNOSIS — J40 Bronchitis, not specified as acute or chronic: Secondary | ICD-10-CM | POA: Insufficient documentation

## 2015-01-05 DIAGNOSIS — R0602 Shortness of breath: Secondary | ICD-10-CM | POA: Diagnosis present

## 2015-01-05 LAB — BASIC METABOLIC PANEL
Anion gap: 8 (ref 5–15)
BUN: 27 mg/dL — AB (ref 6–23)
CALCIUM: 8.7 mg/dL (ref 8.4–10.5)
CO2: 27 mmol/L (ref 19–32)
Chloride: 103 mmol/L (ref 96–112)
Creatinine, Ser: 0.91 mg/dL (ref 0.50–1.35)
GFR calc Af Amer: 88 mL/min — ABNORMAL LOW (ref >90)
GFR calc non Af Amer: 76 mL/min — ABNORMAL LOW (ref >90)
GLUCOSE: 120 mg/dL — AB (ref 70–99)
Potassium: 4.5 mmol/L (ref 3.5–5.1)
SODIUM: 138 mmol/L (ref 135–145)

## 2015-01-05 LAB — CBC
HCT: 44.4 % (ref 39.0–52.0)
HEMOGLOBIN: 14.4 g/dL (ref 13.0–17.0)
MCH: 31.2 pg (ref 26.0–34.0)
MCHC: 32.4 g/dL (ref 30.0–36.0)
MCV: 96.3 fL (ref 78.0–100.0)
Platelets: 144 10*3/uL — ABNORMAL LOW (ref 150–400)
RBC: 4.61 MIL/uL (ref 4.22–5.81)
RDW: 14.4 % (ref 11.5–15.5)
WBC: 12.2 10*3/uL — AB (ref 4.0–10.5)

## 2015-01-05 LAB — BRAIN NATRIURETIC PEPTIDE: B NATRIURETIC PEPTIDE 5: 224.8 pg/mL — AB (ref 0.0–100.0)

## 2015-01-05 MED ORDER — IPRATROPIUM-ALBUTEROL 0.5-2.5 (3) MG/3ML IN SOLN
RESPIRATORY_TRACT | Status: AC
  Administered 2015-01-05: 3 mL via RESPIRATORY_TRACT
  Filled 2015-01-05: qty 3

## 2015-01-05 MED ORDER — ALBUTEROL SULFATE HFA 108 (90 BASE) MCG/ACT IN AERS: 2.0000 | INHALATION_SPRAY | RESPIRATORY_TRACT | Status: DC | PRN

## 2015-01-05 MED ORDER — PREDNISONE 20 MG PO TABS: 40.0000 mg | ORAL_TABLET | Freq: Every day | ORAL | Status: AC

## 2015-01-05 MED ORDER — IPRATROPIUM-ALBUTEROL 0.5-2.5 (3) MG/3ML IN SOLN
3.0000 mL | Freq: Once | RESPIRATORY_TRACT | Status: AC
  Administered 2015-01-05: 3 mL via RESPIRATORY_TRACT
  Filled 2015-01-05: qty 3

## 2015-01-05 MED ORDER — PREDNISONE 20 MG PO TABS
40.0000 mg | ORAL_TABLET | Freq: Once | ORAL | Status: AC
  Administered 2015-01-05: 40 mg via ORAL
  Filled 2015-01-05: qty 2

## 2015-01-05 NOTE — ED Notes (Signed)
Pt, family, and caregiver report pt has had a cold for a few days, SOB started last night continuing today. Wheezing increased today. Pt went to pcp which they stated oxygen sat was 93% and then 87%. Pt 99% on room air, able to speak in full sentences. Denies pain. Denies n/v/d. Denies dysuria.

## 2015-01-05 NOTE — ED Provider Notes (Signed)
CSN: [id number]     Arrival date & time 01/05/15  1612 History   First MD Initiated Contact with Patient 01/05/15 1714     Chief Complaint  Patient presents with  . Shortness of Breath     (Consider location/radiation/quality/duration/timing/severity/associated sxs/prior Treatment) HPI  81 year old male presents with cough and congestion for the last 3-4 days. Patient is short of breath when coughing but denies shortness of breath at rest or with exertion. He has trouble walking due to Parkinson disease, but has not noticed any increased shortness of breath. His cough is productive. He has not had any fevers. Has been having rhinorrhea. Patient went to PCPs office because his home treatments weren't working for cough and they noticed as to be above 93%. However he is given albuterol inhaler and his sats decreased 87 presents and is sent to the ER. Patient currently feels well except for the cough. Chronically has lower extremity swelling but wife and home health nurse feel like it may be slightly increased bilaterally today.  Past Medical History  Diagnosis Date  . Macular degeneration   . History of prostate cancer   . Infection, bartonella   . Spinal stenosis   . Parkinson's disease 05-2012  . Cancer   . Hyperglycemia    Past Surgical History  Procedure Laterality Date  . Appendectomy    . Tonsillectomy    . Prostate surgery  1998    surgery and then radiation 7 years later in 2005  . Colonoscopy with propofol N/A 03/03/2013    Procedure: COLONOSCOPY WITH PROPOFOL;  Surgeon: Garlan Fair, MD;  Location: [facility];  Service: Endoscopy;  Laterality: N/A;   History reviewed. No pertinent family history. History  Substance Use Topics  . Smoking status: Former Smoker -- 0.25 packs/day for 10 years    Quit date: 11/27/1970  . Smokeless tobacco: Never Used  . Alcohol Use: Yes    Review of Systems  Constitutional: Negative for fever.  HENT: Positive for congestion and  rhinorrhea.   Respiratory: Positive for cough, shortness of breath and wheezing.   Cardiovascular: Positive for leg swelling. Negative for chest pain.  All other systems reviewed and are negative.     Allergies  Sulfa antibiotics; Sulfa drugs cross reactors; and Ciprofloxacin  Home Medications   Prior to Admission medications   Medication Sig Start Date End Date Taking? Authorizing Provider  acetaminophen (TYLENOL) 500 MG tablet Take 500 mg by mouth every 6 (six) hours as needed for pain.    Historical Provider, MD  beta carotene w/minerals (OCUVITE) tablet Take 1 tablet by mouth daily.    Historical Provider, MD  carbidopa-levodopa (SINEMET IR) 25-100 MG per tablet Take 1.5 tablets by mouth 3 (three) times daily.    Historical Provider, MD  cefdinir (OMNICEF) 300 MG capsule Take 1 capsule (300 mg total) by mouth 2 (two) times daily. 03/30/14   Vernie Shanks, MD  cephALEXin (KEFLEX) 500 MG capsule Take 1 capsule (500 mg total) by mouth 2 (two) times daily. 03/28/14   Erby Pian, FNP  etodolac (LODINE) 400 MG tablet  09/15/13   Historical Provider, MD  ferrous sulfate 325 (65 FE) MG tablet Take 325 mg by mouth daily with breakfast.    Historical Provider, MD  folic acid (FOLVITE) 947 MCG tablet Take 400 mcg by mouth daily.    Historical Provider, MD  hydrocortisone 2.5 % cream  03/16/14   Historical Provider, MD  hydroxypropyl methylcellulose (ISOPTO TEARS) 2.5 % ophthalmic  solution Place 1 drop into both eyes as needed (dry eyes).    Historical Provider, MD  mirabegron ER (MYRBETRIQ) 25 MG TB24 Take 25 mg by mouth daily. Take 50 mg daily    Historical Provider, MD  PROAIR HFA 108 (90 BASE) MCG/ACT inhaler  01/26/14   Historical Provider, MD  sildenafil (VIAGRA) 100 MG tablet Take 1 tablet (100 mg total) by mouth daily as needed for erectile dysfunction. 07/31/13   Vernie Shanks, MD  tadalafil (CIALIS) 5 MG tablet Take 1 tablet (5 mg total) by mouth daily as needed for erectile dysfunction.  03/19/14   Vernie Shanks, MD   BP 130/80 mmHg  Pulse 77  Temp(Src) 98.7 F (37.1 C) (Oral)  Resp 16  SpO2 97% Physical Exam  Constitutional: He is oriented to person, place, and time. He appears well-developed and well-nourished. No distress.  HENT:  Head: Normocephalic and atraumatic.  Right Ear: External ear normal.  Left Ear: External ear normal.  Nose: Nose normal.  Eyes: Right eye exhibits no discharge. Left eye exhibits no discharge.  Neck: Neck supple.  Cardiovascular: Normal rate, regular rhythm, normal heart sounds and intact distal pulses.   Pulmonary/Chest: Effort normal. No stridor. He has wheezes. He has no rales.  Abdominal: Soft. He exhibits no distension. There is no tenderness.  Musculoskeletal: He exhibits no edema.  Neurological: He is alert and oriented to person, place, and time.  Skin: Skin is warm and dry. He is not diaphoretic.  Nursing note and vitals reviewed.   ED Course  Procedures (including critical care time) Labs Review Labs Reviewed  CBC - Abnormal; Notable for the following:    WBC 12.2 (*)    Platelets 144 (*)    All other components within normal limits  BASIC METABOLIC PANEL - Abnormal; Notable for the following:    Glucose, Bld 120 (*)    BUN 27 (*)    GFR calc non Af Amer 76 (*)    GFR calc Af Amer 88 (*)    All other components within normal limits  BRAIN NATRIURETIC PEPTIDE - Abnormal; Notable for the following:    B Natriuretic Peptide 224.8 (*)    All other components within normal limits    Imaging Review Dg Chest 2 View (if Patient Has Fever And/or Copd)  01/05/2015   CLINICAL DATA:  COPD diagnosed 30 years ago. Hx asthma, Chronic SOB. Increased SOB starting 01-02-15.  EXAM: CHEST - 2 VIEW  COMPARISON:  12/01/2014  FINDINGS: Lungs are clear. Heart size upper limits normal. Mildly tortuous atheromatous thoracic aorta. No effusion. Spurring in the lower thoracic spine.  IMPRESSION: No acute cardiopulmonary disease.    Electronically Signed   By: Lucrezia Europe M.D.   On: 01/05/2015 18:40     EKG Interpretation None      MDM   Final diagnoses:  Bronchitis    Patient symptoms are consistent with bronchitis. While ambulating after a DuoNeb his sats remained 90% and above. He showed no signs of increased work of breathing. X-ray shows no pneumonia. At this point I feel he most likely has a viral respiratory infection and is stable for discharge. Given bronchospasm history will treat with albuterol and steroid burst.    Ephraim Hamburger, MD 01/05/15 2359

## 2015-01-05 NOTE — Discharge Instructions (Signed)
How to Use an Inhaler Proper inhaler technique is very important. Good technique ensures that the medicine reaches the lungs. Poor technique results in depositing the medicine on the tongue and back of the throat rather than in the airways. If you do not use the inhaler with good technique, the medicine will not help you. STEPS TO FOLLOW IF USING AN INHALER WITHOUT AN EXTENSION TUBE  Remove the cap from the inhaler.  If you are using the inhaler for the first time, you will need to prime it. Shake the inhaler for 5 seconds and release four puffs into the air, away from your face. Ask your health care provider or pharmacist if you have questions about priming your inhaler.  Shake the inhaler for 5 seconds before each breath in (inhalation).  Position the inhaler so that the top of the canister faces up.  Put your index finger on the top of the medicine canister. Your thumb supports the bottom of the inhaler.  Open your mouth.  Either place the inhaler between your teeth and place your lips tightly around the mouthpiece, or hold the inhaler 1-2 inches away from your open mouth. If you are unsure of which technique to use, ask your health care provider.  Breathe out (exhale) normally and as completely as possible.  Press the canister down with your index finger to release the medicine.  At the same time as the canister is pressed, inhale deeply and slowly until your lungs are completely filled. This should take 4-6 seconds. Keep your tongue down.  Hold the medicine in your lungs for 5-10 seconds (10 seconds is best). This helps the medicine get into the small airways of your lungs.  Breathe out slowly, through pursed lips. Whistling is an example of pursed lips.  Wait at least 15-30 seconds between puffs. Continue with the above steps until you have taken the number of puffs your health care provider has ordered. Do not use the inhaler more than your health care provider tells  you.  Replace the cap on the inhaler.  Follow the directions from your health care provider or the inhaler insert for cleaning the inhaler. STEPS TO FOLLOW IF USING AN INHALER WITH AN EXTENSION (SPACER)  Remove the cap from the inhaler.  If you are using the inhaler for the first time, you will need to prime it. Shake the inhaler for 5 seconds and release four puffs into the air, away from your face. Ask your health care provider or pharmacist if you have questions about priming your inhaler.  Shake the inhaler for 5 seconds before each breath in (inhalation).  Place the open end of the spacer onto the mouthpiece of the inhaler.  Position the inhaler so that the top of the canister faces up and the spacer mouthpiece faces you.  Put your index finger on the top of the medicine canister. Your thumb supports the bottom of the inhaler and the spacer.  Breathe out (exhale) normally and as completely as possible.  Immediately after exhaling, place the spacer between your teeth and into your mouth. Close your lips tightly around the spacer.  Press the canister down with your index finger to release the medicine.  At the same time as the canister is pressed, inhale deeply and slowly until your lungs are completely filled. This should take 4-6 seconds. Keep your tongue down and out of the way.  Hold the medicine in your lungs for 5-10 seconds (10 seconds is best). This helps the  medicine get into the small airways of your lungs. Exhale.  Repeat inhaling deeply through the spacer mouthpiece. Again hold that breath for up to 10 seconds (10 seconds is best). Exhale slowly. If it is difficult to take this second deep breath through the spacer, breathe normally several times through the spacer. Remove the spacer from your mouth.  Wait at least 15-30 seconds between puffs. Continue with the above steps until you have taken the number of puffs your health care provider has ordered. Do not use the  inhaler more than your health care provider tells you.  Remove the spacer from the inhaler, and place the cap on the inhaler.  Follow the directions from your health care provider or the inhaler insert for cleaning the inhaler and spacer. If you are using different kinds of inhalers, use your quick relief medicine to open the airways 10-15 minutes before using a steroid if instructed to do so by your health care provider. If you are unsure which inhalers to use and the order of using them, ask your health care provider, nurse, or respiratory therapist. If you are using a steroid inhaler, always rinse your mouth with water after your last puff, then gargle and spit out the water. Do not swallow the water. AVOID:  Inhaling before or after starting the spray of medicine. It takes practice to coordinate your breathing with triggering the spray.  Inhaling through the nose (rather than the mouth) when triggering the spray. HOW TO DETERMINE IF YOUR INHALER IS FULL OR NEARLY EMPTY You cannot know when an inhaler is empty by shaking it. A few inhalers are now being made with dose counters. Ask your health care provider for a prescription that has a dose counter if you feel you need that extra help. If your inhaler does not have a counter, ask your health care provider to help you determine the date you need to refill your inhaler. Write the refill date on a calendar or your inhaler canister. Refill your inhaler 7-10 days before it runs out. Be sure to keep an adequate supply of medicine. This includes making sure it is not expired, and that you have a spare inhaler.  SEEK MEDICAL CARE IF:   Your symptoms are only partially relieved with your inhaler.  You are having trouble using your inhaler.  You have some increase in phlegm. SEEK IMMEDIATE MEDICAL CARE IF:   You feel little or no relief with your inhalers. You are still wheezing and are feeling shortness of breath or tightness in your chest or  both.  You have dizziness, headaches, or a fast heart rate.  You have chills, fever, or night sweats.  You have a noticeable increase in phlegm production, or there is blood in the phlegm. MAKE SURE YOU:   Understand these instructions.  Will watch your condition.  Will get help right away if you are not doing well or get worse. Document Released: 11/10/2000 Document Revised: 09/03/2013 Document Reviewed: 06/12/2013 [facility] Patient Information 2015 [place], [place]. This information is not intended to replace advice given to you by your health care provider. Make sure you discuss any questions you have with your health care provider.    Acute Bronchitis Bronchitis is inflammation of the airways that extend from the windpipe into the lungs (bronchi). The inflammation often causes mucus to develop. This leads to a cough, which is the most common symptom of bronchitis.  In acute bronchitis, the condition usually develops suddenly and goes away over time,  usually in a couple weeks. Smoking, allergies, and asthma can make bronchitis worse. Repeated episodes of bronchitis may cause further lung problems.  CAUSES Acute bronchitis is most often caused by the same virus that causes a cold. The virus can spread from person to person (contagious) through coughing, sneezing, and touching contaminated objects. SIGNS AND SYMPTOMS   Cough.   Fever.   Coughing up mucus.   Body aches.   Chest congestion.   Chills.   Shortness of breath.   Sore throat.  DIAGNOSIS  Acute bronchitis is usually diagnosed through a physical exam. Your health care provider will also ask you questions about your medical history. Tests, such as chest X-rays, are sometimes done to rule out other conditions.  TREATMENT  Acute bronchitis usually goes away in a couple weeks. Oftentimes, no medical treatment is necessary. Medicines are sometimes given for relief of fever or cough. Antibiotic medicines are  usually not needed but may be prescribed in certain situations. In some cases, an inhaler may be recommended to help reduce shortness of breath and control the cough. A cool mist vaporizer may also be used to help thin bronchial secretions and make it easier to clear the chest.  HOME CARE INSTRUCTIONS  Get plenty of rest.   Drink enough fluids to keep your urine clear or pale yellow (unless you have a medical condition that requires fluid restriction). Increasing fluids may help thin your respiratory secretions (sputum) and reduce chest congestion, and it will prevent dehydration.   Take medicines only as directed by your health care provider.  If you were prescribed an antibiotic medicine, finish it all even if you start to feel better.  Avoid smoking and secondhand smoke. Exposure to cigarette smoke or irritating chemicals will make bronchitis worse. If you are a smoker, consider using nicotine gum or skin patches to help control withdrawal symptoms. Quitting smoking will help your lungs heal faster.   Reduce the chances of another bout of acute bronchitis by washing your hands frequently, avoiding people with cold symptoms, and trying not to touch your hands to your mouth, nose, or eyes.   Keep all follow-up visits as directed by your health care provider.  SEEK MEDICAL CARE IF: Your symptoms do not improve after 1 week of treatment.  SEEK IMMEDIATE MEDICAL CARE IF:  You develop an increased fever or chills.   You have chest pain.   You have severe shortness of breath.  You have bloody sputum.   You develop dehydration.  You faint or repeatedly feel like you are going to pass out.  You develop repeated vomiting.  You develop a severe headache. MAKE SURE YOU:   Understand these instructions.  Will watch your condition.  Will get help right away if you are not doing well or get worse. Document Released: 12/21/2004 Document Revised: 03/30/2014 Document Reviewed:  05/06/2013 [facility] Patient Information 2015 [place], [place]. This information is not intended to replace advice given to you by your health care provider. Make sure you discuss any questions you have with your health care provider.

## 2015-01-05 NOTE — ED Notes (Signed)
Pt able to ambulate down the hall---- O2 sat remains 90% on room air while ambulating.

## 2015-01-18 ENCOUNTER — Ambulatory Visit: Payer: Medicare Other

## 2015-01-18 ENCOUNTER — Ambulatory Visit (INDEPENDENT_AMBULATORY_CARE_PROVIDER_SITE_OTHER): Payer: Medicare Other | Admitting: Podiatry

## 2015-01-18 ENCOUNTER — Encounter: Payer: Self-pay | Admitting: Podiatry

## 2015-01-18 VITALS — BP 128/69 | HR 67 | Resp 16

## 2015-01-18 DIAGNOSIS — L84 Corns and callosities: Secondary | ICD-10-CM | POA: Diagnosis not present

## 2015-01-18 DIAGNOSIS — M79675 Pain in left toe(s): Secondary | ICD-10-CM

## 2015-01-18 DIAGNOSIS — M2041 Other hammer toe(s) (acquired), right foot: Secondary | ICD-10-CM

## 2015-01-18 NOTE — Progress Notes (Signed)
   Subjective:    Patient ID: Raymond Valdez, male    DOB: July 15, 1929, 81 y.o.   MRN: [id number]  HPI Comments: "I have pain in the tip of the toes"  Patient c/o tender 2nd and 3rd toes right for several weeks. The tips of the toes are callused. No home treatment.  Toe Pain       Review of Systems  All other systems reviewed and are negative.      Objective:   Physical Exam        Assessment & Plan:

## 2015-01-19 NOTE — Progress Notes (Signed)
Subjective:     Patient ID: Raymond Valdez, male   DOB: [birth year], 81 y.o.   MRN: [id number]  HPI patient presents with caregiver with pain in the right second and third toes with distal keratotic lesion noted third over second toe and rigid contracture. Patient states that it's hard to walk on these and he does have Parkinson's which does make it more difficult   Review of Systems  All other systems reviewed and are negative.      Objective:   Physical Exam  Constitutional: He is oriented to person, place, and time.  Cardiovascular: Intact distal pulses.   Musculoskeletal: Normal range of motion.  Neurological: He is oriented to person, place, and time.  Skin: Skin is warm.  Nursing note and vitals reviewed.  neurovascular status intact with muscle strength adequate and range of motion within normal limits. Patient's noted to have good digital perfusion is well oriented and does have some instability with gait. Noted to have rigid contracture of the lesser digits left over right with distal keratotic lesions of the third and second toe right foot     Assessment:     Hammertoe deformity with distal keratotic lesion second and third toes secondary to pressure    Plan:     H&P and x-rays reviewed. Distal debridement accomplished and I applied silicone buttress pad to reduce pressure against the toes and advised that we may need to straighten them if symptoms were to come back quickly

## 2015-11-10 ENCOUNTER — Emergency Department (HOSPITAL_COMMUNITY): Payer: Medicare Other

## 2015-11-10 ENCOUNTER — Encounter (HOSPITAL_COMMUNITY): Payer: Self-pay | Admitting: Emergency Medicine

## 2015-11-10 ENCOUNTER — Emergency Department (HOSPITAL_COMMUNITY)
Admission: EM | Admit: 2015-11-10 | Discharge: 2015-11-10 | Disposition: A | Discharge status: Home / Self Care | Payer: Medicare Other | Attending: Emergency Medicine | Admitting: Emergency Medicine

## 2015-11-10 DIAGNOSIS — Z8619 Personal history of other infectious and parasitic diseases: Secondary | ICD-10-CM | POA: Diagnosis not present

## 2015-11-10 DIAGNOSIS — W01198A Fall on same level from slipping, tripping and stumbling with subsequent striking against other object, initial encounter: Secondary | ICD-10-CM | POA: Diagnosis not present

## 2015-11-10 DIAGNOSIS — Y9289 Other specified places as the place of occurrence of the external cause: Secondary | ICD-10-CM | POA: Insufficient documentation

## 2015-11-10 DIAGNOSIS — Z79899 Other long term (current) drug therapy: Secondary | ICD-10-CM | POA: Diagnosis not present

## 2015-11-10 DIAGNOSIS — Z8546 Personal history of malignant neoplasm of prostate: Secondary | ICD-10-CM | POA: Diagnosis not present

## 2015-11-10 DIAGNOSIS — R5383 Other fatigue: Secondary | ICD-10-CM | POA: Diagnosis not present

## 2015-11-10 DIAGNOSIS — S0990XA Unspecified injury of head, initial encounter: Secondary | ICD-10-CM | POA: Insufficient documentation

## 2015-11-10 DIAGNOSIS — Z87891 Personal history of nicotine dependence: Secondary | ICD-10-CM | POA: Diagnosis not present

## 2015-11-10 DIAGNOSIS — Y998 Other external cause status: Secondary | ICD-10-CM | POA: Diagnosis not present

## 2015-11-10 DIAGNOSIS — Z8739 Personal history of other diseases of the musculoskeletal system and connective tissue: Secondary | ICD-10-CM | POA: Insufficient documentation

## 2015-11-10 DIAGNOSIS — G20 Parkinson's disease: Secondary | ICD-10-CM | POA: Insufficient documentation

## 2015-11-10 DIAGNOSIS — Y9389 Activity, other specified: Secondary | ICD-10-CM | POA: Diagnosis not present

## 2015-11-10 DIAGNOSIS — W19XXXA Unspecified fall, initial encounter: Secondary | ICD-10-CM

## 2015-11-10 NOTE — ED Notes (Signed)
Patient transported to X-ray 

## 2015-11-10 NOTE — ED Notes (Signed)
Pt fell on Saturday; pt sts pain in tailbone area since fall; pt sts pain in right side of head; per family pt having trouble standing

## 2015-11-10 NOTE — ED Notes (Signed)
Pt is in stable condition upon d/c and is escorted from ED via wheelchair. 

## 2015-11-10 NOTE — ED Provider Notes (Signed)
CSN: [id number]     Arrival date & time 11/10/15  1213 History   First MD Initiated Contact with Patient 11/10/15 1305     Chief Complaint  Patient presents with  . Fall     (Consider location/radiation/quality/duration/timing/severity/associated sxs/prior Treatment) Patient is a 81 y.o. male presenting with fall. The history is provided by the patient.  Fall Associated symptoms include headaches. Pertinent negatives include no chest pain, no abdominal pain and no shortness of breath.   patient had a fall 4 days ago. States his been a little more unsteady since then. He has a history of Parkinson's disease somewhat unsteady at baseline. Reportedly had his right leg give out more now. States he fell backwards and slid down the cabinets on the floor. He has a slight headache. No confusion. No dysuria. On Monday had a negative urine culture. No fevers. 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  Past Medical History  Diagnosis Date  . Macular degeneration   . History of prostate cancer   . Infection, bartonella   . Spinal stenosis   . Parkinson's disease ([place]) 05-2012  . Cancer ([place])   . Hyperglycemia    Past Surgical History  Procedure Laterality Date  . Appendectomy    . Tonsillectomy    . Prostate surgery  1998    surgery and then radiation 7 years later in 2005  . Colonoscopy with propofol N/A 03/03/2013    Procedure: COLONOSCOPY WITH PROPOFOL;  Surgeon: Garlan Fair, MD;  Location: [facility] ENDOSCOPY;  Service: Endoscopy;  Laterality: N/A;   History reviewed. No pertinent family history. Social History  Substance Use Topics  . Smoking status: Former Smoker -- 0.25 packs/day for 10 years    Quit date: 11/27/1970  . Smokeless tobacco: Never Used  . Alcohol Use: Yes    Review of Systems  Constitutional: Positive for fatigue. Negative for appetite change.  Respiratory: Negative for chest tightness and shortness of breath.   Cardiovascular: Negative for  chest pain.  Gastrointestinal: Negative for abdominal pain.  Genitourinary: Negative for hematuria.  Musculoskeletal: Negative for back pain and joint swelling.  Neurological: Positive for headaches.  Hematological: Does not bruise/bleed easily.      Allergies  Sulfa antibiotics; Sulfa drugs cross reactors; and Ciprofloxacin  Home Medications   Prior to Admission medications   Medication Sig Start Date End Date Taking? Authorizing Provider  albuterol (PROVENTIL HFA;VENTOLIN HFA) 108 (90 BASE) MCG/ACT inhaler Inhale 2 puffs into the lungs every 4 (four) hours as needed for wheezing or shortness of breath. 01/05/15   Sherwood Gambler, MD  beta carotene w/minerals (OCUVITE) tablet Take 1 tablet by mouth daily.    Historical Provider, MD  carbidopa-levodopa (SINEMET IR) 25-100 MG per tablet Take 1-1.5 tablets by mouth 4 (four) times daily. Take 1.5 Tablets By Mouth in the morning and then Take 1 Tablet By Mouth 3 More Times Daily.    Historical Provider, MD  CRANBERRY PO Take 1 capsule by mouth 3 (three) times daily.    Historical Provider, MD  sildenafil (VIAGRA) 100 MG tablet Take 1 tablet (100 mg total) by mouth daily as needed for erectile dysfunction. Patient not taking: Reported on 01/05/2015 07/31/13   Vernie Shanks, MD  tadalafil (CIALIS) 5 MG tablet Take 1 tablet (5 mg total) by mouth daily as needed for erectile dysfunction. Patient not taking: Reported on 01/05/2015 03/19/14   Vernie Shanks, MD  trimethoprim (TRIMPEX) 100 MG tablet Take 100  mg by mouth daily.    Historical Provider, MD   BP 143/95 mmHg  Pulse 63  Temp(Src) 97.8 F (36.6 C) (Oral)  Resp 15  SpO2 97% Physical Exam  Constitutional: He appears well-developed.  Cardiovascular: Normal rate and regular rhythm.   Pulmonary/Chest: Effort normal.  Musculoskeletal: He exhibits no tenderness.  Neurological: He is alert.  Tremulousness. Difficulty with standing. Equal flexion and extension bilaterally at knees and ankles   Skin: Skin is warm.    ED Course  Procedures (including critical care time) Labs Review Labs Reviewed - No data to display  Imaging Review Dg Pelvis 1-2 Views  11/10/2015  CLINICAL DATA:  81 year old male status post fall 4 days ago with hip pain. Initial encounter. EXAM: PELVIS - 1-2 VIEW COMPARISON:  CT Abdomen and Pelvis 08/30/2005. FINDINGS: Femoral heads remain normally located. Hip joint spaces do not appear significantly changed since 2006. Grossly intact proximal femurs. Iliac and femoral artery calcified atherosclerosis greater on the left. No acute pelvic fracture. Right lower quadrant and central pelvic surgical clips re- demonstrated. IMPRESSION: No acute fracture or dislocation identified about the pelvis. If there is lateralizing hip pain, recommend dedicated hip series. Electronically Signed   By: Genevie Ann M.D.   On: 11/10/2015 14:01   Ct Head Wo Contrast  11/10/2015  CLINICAL DATA:  Fall 4 days ago. Hit head. Difficulty forming words. The patient denies headaches. EXAM: CT HEAD WITHOUT CONTRAST TECHNIQUE: Contiguous axial images were obtained from the base of the skull through the vertex without intravenous contrast. COMPARISON:  CT head without contrast 12/01/2014. FINDINGS: Mild atrophy and white matter disease is stable. Acute cortical infarct, hemorrhage, or mass lesion is present. The basal ganglia are intact. No significant extra-axial fluid collection is present. The ventricles are proportionate to the degree of atrophy. Mild mucosal thickening is present in the ethmoid air cells. The paranasal sinuses and mastoid air cells are otherwise clear. The calvarium is intact. No significant extracranial soft tissue lesions are present. IMPRESSION: 1. No acute intracranial abnormality or significant interval change. 2. Stable atrophy and white matter disease. Electronically Signed   By: San Morelle M.D.   On: 11/10/2015 14:26   I have personally reviewed and evaluated these  images and lab results as part of my medical decision-making.   EKG Interpretation None      MDM   Final diagnoses:  Fall, initial encounter    Patient is a retired Pharmacist, community with [employer].. X-rays reassuring. Doubt severe injury. Appears premorbid general weakness. Discharge home.    Davonna Belling, MD 11/11/15 [phone]

## 2016-04-27 ENCOUNTER — Emergency Department (HOSPITAL_COMMUNITY): Payer: Medicare Other

## 2016-04-27 ENCOUNTER — Emergency Department (HOSPITAL_COMMUNITY)
Admission: EM | Admit: 2016-04-27 | Discharge: 2016-04-27 | Disposition: A | Discharge status: Home / Self Care | Payer: Medicare Other | Attending: Emergency Medicine | Admitting: Emergency Medicine

## 2016-04-27 ENCOUNTER — Encounter (HOSPITAL_COMMUNITY): Payer: Self-pay | Admitting: Emergency Medicine

## 2016-04-27 DIAGNOSIS — Z87891 Personal history of nicotine dependence: Secondary | ICD-10-CM | POA: Insufficient documentation

## 2016-04-27 DIAGNOSIS — R1310 Dysphagia, unspecified: Secondary | ICD-10-CM

## 2016-04-27 DIAGNOSIS — G20 Parkinson's disease: Secondary | ICD-10-CM | POA: Insufficient documentation

## 2016-04-27 DIAGNOSIS — R4182 Altered mental status, unspecified: Secondary | ICD-10-CM | POA: Diagnosis present

## 2016-04-27 DIAGNOSIS — Z8546 Personal history of malignant neoplasm of prostate: Secondary | ICD-10-CM | POA: Diagnosis not present

## 2016-04-27 LAB — COMPREHENSIVE METABOLIC PANEL
ALK PHOS: 68 U/L (ref 38–126)
ALT: 7 U/L — ABNORMAL LOW (ref 17–63)
AST: 17 U/L (ref 15–41)
Albumin: 3.6 g/dL (ref 3.5–5.0)
Anion gap: 7 (ref 5–15)
BUN: 23 mg/dL — AB (ref 6–20)
CO2: 27 mmol/L (ref 22–32)
CREATININE: 0.84 mg/dL (ref 0.61–1.24)
Calcium: 8.7 mg/dL — ABNORMAL LOW (ref 8.9–10.3)
Chloride: 105 mmol/L (ref 101–111)
Glucose, Bld: 89 mg/dL (ref 65–99)
Potassium: 4.6 mmol/L (ref 3.5–5.1)
Sodium: 139 mmol/L (ref 135–145)
Total Bilirubin: 0.8 mg/dL (ref 0.3–1.2)
Total Protein: 6.8 g/dL (ref 6.5–8.1)

## 2016-04-27 LAB — CBC WITH DIFFERENTIAL/PLATELET
Basophils Absolute: 0 10*3/uL (ref 0.0–0.1)
Basophils Relative: 0 %
EOS PCT: 2 %
Eosinophils Absolute: 0.2 10*3/uL (ref 0.0–0.7)
HCT: 47.4 % (ref 39.0–52.0)
HEMOGLOBIN: 15.5 g/dL (ref 13.0–17.0)
LYMPHS ABS: 3.2 10*3/uL (ref 0.7–4.0)
Lymphocytes Relative: 30 %
MCH: 30 pg (ref 26.0–34.0)
MCHC: 32.7 g/dL (ref 30.0–36.0)
MCV: 91.7 fL (ref 78.0–100.0)
MONO ABS: 0.3 10*3/uL (ref 0.1–1.0)
Monocytes Relative: 3 %
Neutro Abs: 6.7 10*3/uL (ref 1.7–7.7)
Neutrophils Relative %: 65 %
Platelets: 158 10*3/uL (ref 150–400)
RBC: 5.17 MIL/uL (ref 4.22–5.81)
RDW: 15.1 % (ref 11.5–15.5)
WBC: 10.5 10*3/uL (ref 4.0–10.5)

## 2016-04-27 LAB — URINALYSIS, ROUTINE W REFLEX MICROSCOPIC
Bilirubin Urine: NEGATIVE
Glucose, UA: NEGATIVE mg/dL
HGB URINE DIPSTICK: NEGATIVE
Ketones, ur: NEGATIVE mg/dL
Leukocytes, UA: NEGATIVE
Nitrite: NEGATIVE
Protein, ur: NEGATIVE mg/dL
Specific Gravity, Urine: 1.013 (ref 1.005–1.030)
pH: 7 (ref 5.0–8.0)

## 2016-04-27 LAB — CBG MONITORING, ED: GLUCOSE-CAPILLARY: 85 mg/dL (ref 65–99)

## 2016-04-27 LAB — I-STAT CG4 LACTIC ACID, ED: Lactic Acid, Venous: 1.84 mmol/L (ref 0.5–2.0)

## 2016-04-27 MED ORDER — SODIUM CHLORIDE 0.9 % IV BOLUS (SEPSIS)
1000.0000 mL | Freq: Once | INTRAVENOUS | Status: AC
  Administered 2016-04-27: 1000 mL via INTRAVENOUS

## 2016-04-27 MED ORDER — LIDOCAINE HCL 2 % EX GEL: 1.0000 "application " | CUTANEOUS | Status: DC | PRN

## 2016-04-27 MED ORDER — ACETAMINOPHEN 500 MG PO TABS: 1000.0000 mg | ORAL_TABLET | Freq: Once | ORAL | Status: DC

## 2016-04-27 NOTE — ED Notes (Addendum)
Per EMS, pt from home, hx Parkinson's. Pts wife states it was harder for patient to get up this morning. Dysphasia x1 week. EMS temp 101.2. No recent chemo/radiation.

## 2016-04-27 NOTE — ED Notes (Signed)
PT refused in and out cath

## 2016-04-27 NOTE — ED Notes (Signed)
Patient unable to void. Pt refused urinary catheterization.

## 2016-04-27 NOTE — ED Notes (Signed)
Patient transported to X-ray 

## 2016-04-27 NOTE — ED Provider Notes (Signed)
CSN: [id number]     Arrival date & time 04/27/16  1041 History   First MD Initiated Contact with Patient 04/27/16 1043     Chief Complaint  Patient presents with  . Altered Mental Status     (Consider location/radiation/quality/duration/timing/severity/associated sxs/prior Treatment) Patient is a 81 y.o. male presenting with general illness. The history is provided by the patient, the spouse and the EMS personnel.  Illness Severity:  Moderate Onset quality:  Gradual Duration:  2 days Timing:  Constant Progression:  Worsening Chronicity:  New Associated symptoms: no abdominal pain, no chest pain, no congestion, no diarrhea, no fever, no headaches, no myalgias, no rash, no shortness of breath and no vomiting    82 yo M With a chief complaint of dysphagia. This been going on for many weeks. Patient has a history of Parkinson's disease was thought to be related to that. States that he is getting food stuck in the back of his throat but has been able to cough it out. Has been able to tolerate liquids but not solids.  History is limited secondary to the extent of his Parkinson's. Family feel like he has had a little bit of a cough denies fevers chills. Denies head injury. Patient feeling the dysphasia is gotten much worse over the past couple days. Had trouble getting out of bed he felt like he was so weak.  Past Medical History  Diagnosis Date  . Macular degeneration   . History of prostate cancer   . Infection, bartonella   . Spinal stenosis   . Parkinson's disease ([place]) 05-2012  . Cancer ([place])   . Hyperglycemia    Past Surgical History  Procedure Laterality Date  . Appendectomy    . Tonsillectomy    . Prostate surgery  1998    surgery and then radiation 7 years later in 2005  . Colonoscopy with propofol N/A 03/03/2013    Procedure: COLONOSCOPY WITH PROPOFOL;  Surgeon: Garlan Fair, MD;  Location: [facility];  Service: Endoscopy;  Laterality: N/A;   History reviewed. No  pertinent family history. Social History  Substance Use Topics  . Smoking status: Former Smoker -- 0.25 packs/day for 10 years    Quit date: 11/27/1970  . Smokeless tobacco: Never Used  . Alcohol Use: Yes    Review of Systems  Constitutional: Negative for fever and chills.  HENT: Negative for congestion and facial swelling.   Eyes: Negative for discharge and visual disturbance.  Respiratory: Negative for shortness of breath.   Cardiovascular: Negative for chest pain and palpitations.  Gastrointestinal: Negative for vomiting, abdominal pain and diarrhea.  Musculoskeletal: Negative for myalgias and arthralgias.  Skin: Negative for color change and rash.  Neurological: Negative for tremors, syncope and headaches.  Psychiatric/Behavioral: Negative for confusion and dysphoric mood.      Allergies  Ciprofloxacin; Sulfa antibiotics; and Sulfa drugs cross reactors  Home Medications   Prior to Admission medications   Medication Sig Start Date End Date Taking? Authorizing Provider  albuterol (PROVENTIL HFA;VENTOLIN HFA) 108 (90 BASE) MCG/ACT inhaler Inhale 2 puffs into the lungs every 4 (four) hours as needed for wheezing or shortness of breath. 01/05/15  Yes Sherwood Gambler, MD  Artificial Tear Solution (GENTEAL TEARS) 0.1-0.2-0.3 % SOLN Place 1-2 drops into both eyes daily as needed (for dry eyes).   Yes Historical Provider, MD  beta carotene w/minerals (OCUVITE) tablet Take 1 tablet by mouth daily.   Yes Historical Provider, MD  carbidopa-levodopa (SINEMET IR) 25-100 MG per tablet  Take 2 tablets by mouth 4 (four) times daily.    Yes Historical Provider, MD  hydrocortisone cream 0.5 % Apply 1 application topically 2 (two) times daily as needed for itching.   Yes Historical Provider, MD  PRESCRIPTION MEDICATION every 6 (six) weeks. "Alia injection" for eyes   Yes Historical Provider, MD  triamcinolone cream (KENALOG) 0.1 % Apply 1 application topically 2 (two) times daily.   Yes Historical  Provider, MD  trimethoprim (TRIMPEX) 100 MG tablet Take 100 mg by mouth daily. Ongoing antibiotic   Yes Historical Provider, MD  sildenafil (VIAGRA) 100 MG tablet Take 1 tablet (100 mg total) by mouth daily as needed for erectile dysfunction. Patient not taking: Reported on 01/05/2015 07/31/13   Vernie Shanks, MD  tadalafil (CIALIS) 5 MG tablet Take 1 tablet (5 mg total) by mouth daily as needed for erectile dysfunction. Patient not taking: Reported on 01/05/2015 03/19/14   Vernie Shanks, MD   BP 98/63 mmHg  Pulse 69  Temp(Src) 97.8 F (36.6 C) (Rectal)  Resp 16  SpO2 98% Physical Exam  Constitutional: He is oriented to person, place, and time. He appears well-developed and well-nourished.  HENT:  Head: Normocephalic and atraumatic.  Eyes: EOM are normal. Pupils are equal, round, and reactive to light.  Neck: Normal range of motion. Neck supple. No JVD present.  Cardiovascular: Normal rate and regular rhythm.  Exam reveals no gallop and no friction rub.   No murmur heard. Pulmonary/Chest: No respiratory distress. He has no wheezes.  Abdominal: He exhibits no distension. There is no rebound and no guarding.  Musculoskeletal: Normal range of motion.  Neurological: He is alert and oriented to person, place, and time.  Skin: No rash noted. No pallor.  Psychiatric: He has a normal mood and affect. His behavior is normal.  Nursing note and vitals reviewed.   ED Course  Procedures (including critical care time) Labs Review Labs Reviewed  COMPREHENSIVE METABOLIC PANEL - Abnormal; Notable for the following:    BUN 23 (*)    Calcium 8.7 (*)    ALT 7 (*)    All other components within normal limits  CULTURE, BLOOD (ROUTINE X 2)  CULTURE, BLOOD (ROUTINE X 2)  URINE CULTURE  CBC WITH DIFFERENTIAL/PLATELET  URINALYSIS, ROUTINE W REFLEX MICROSCOPIC (NOT AT [facility])  CBG MONITORING, ED  I-STAT CG4 LACTIC ACID, ED    Imaging Review Dg Chest 2 View  04/27/2016  CLINICAL DATA:  Fever today.   Difficulty swallowing for 1 week. EXAM: CHEST  2 VIEW COMPARISON:  01/05/2015 FINDINGS: Cardiomediastinal silhouette is normal. Mediastinal contours appear intact. Calcified atherosclerotic disease and tortuosity of the aorta are noted. There is no evidence of focal airspace consolidation, pleural effusion or pneumothorax. Osseous structures are without acute abnormality. Soft tissues are grossly normal. IMPRESSION: No active cardiopulmonary disease. Electronically Signed   By: Fidela Salisbury M.D.   On: 04/27/2016 11:48   I have personally reviewed and evaluated these images and lab results as part of my medical decision-making.   EKG Interpretation   Date/Time:  Thursday April 27 2016 10:58:22 EDT Ventricular Rate:  80 PR Interval:  214 QRS Duration: 146 QT Interval:  438 QTC Calculation: 505 R Axis:   23 Text Interpretation:  Sinus rhythm Paired ventricular premature complexes  Borderline prolonged PR interval Right bundle branch block No significant  change since last tracing Confirmed by Gitty Osterlund MD, DANIEL [id number]) on  04/27/2016 2:22:47 PM      MDM  Final diagnoses:  Dysphagia    82 yo M with a chief complaints of dysphagia. Patient also had significant weakness this morning he was noted by EMS to have a temperature of 102.2. Will evaluate for possible infection. We'll discuss with GI.  Dr. Paulita Fujita, GI feel safe for outpatient follow up.  No UTI, no pna, labs reassuring.  Will have follow up with pcp, gi, neuro.  2:25 PM:  I have discussed the diagnosis/risks/treatment options with the patient and family and believe the pt to be eligible for discharge home to follow-up with PCP, neuro, GI. We also discussed returning to the ED immediately if new or worsening sx occur. We discussed the sx which are most concerning (e.g., sudden worsening pain, fever, inability to tolerate by mouth) that necessitate immediate return. Medications administered to the patient during their visit and any  new prescriptions provided to the patient are listed below.  Medications given during this visit Medications  acetaminophen (TYLENOL) tablet 1,000 mg (1,000 mg Oral Not Given 04/27/16 1134)  lidocaine (XYLOCAINE) 2 % jelly 1 application (not administered)  sodium chloride 0.9 % bolus 1,000 mL (0 mLs Intravenous Stopped 04/27/16 1316)    New Prescriptions   No medications on file    The patient appears reasonably screen and/or stabilized for discharge and I doubt any other medical condition or other [facility] requiring further screening, evaluation, or treatment in the ED at this time prior to discharge.     Deno Etienne, DO 04/27/16 1425

## 2016-04-27 NOTE — ED Notes (Signed)
Bed: WA17 Expected date:  Expected time:  Means of arrival:  Comments: 80s/fever/AMS

## 2016-04-27 NOTE — Discharge Instructions (Signed)

## 2016-04-28 LAB — URINE CULTURE

## 2016-05-02 ENCOUNTER — Encounter (HOSPITAL_COMMUNITY): Payer: Self-pay | Admitting: Physical Medicine and Rehabilitation

## 2016-05-02 ENCOUNTER — Emergency Department (HOSPITAL_COMMUNITY): Payer: Medicare Other

## 2016-05-02 ENCOUNTER — Inpatient Hospital Stay (HOSPITAL_COMMUNITY)
Admission: EM | Admit: 2016-05-02 | Discharge: 2016-05-06 | DRG: 871 | Disposition: A | Discharge status: Hospice | Payer: Medicare Other | Attending: Pulmonary Disease | Admitting: Pulmonary Disease

## 2016-05-02 ENCOUNTER — Observation Stay (HOSPITAL_COMMUNITY): Payer: Medicare Other

## 2016-05-02 DIAGNOSIS — R630 Anorexia: Secondary | ICD-10-CM

## 2016-05-02 DIAGNOSIS — Z4682 Encounter for fitting and adjustment of non-vascular catheter: Secondary | ICD-10-CM

## 2016-05-02 DIAGNOSIS — Z79899 Other long term (current) drug therapy: Secondary | ICD-10-CM

## 2016-05-02 DIAGNOSIS — H3530 Unspecified macular degeneration: Secondary | ICD-10-CM | POA: Diagnosis present

## 2016-05-02 DIAGNOSIS — R7989 Other specified abnormal findings of blood chemistry: Secondary | ICD-10-CM | POA: Diagnosis present

## 2016-05-02 DIAGNOSIS — I469 Cardiac arrest, cause unspecified: Secondary | ICD-10-CM

## 2016-05-02 DIAGNOSIS — R1310 Dysphagia, unspecified: Secondary | ICD-10-CM | POA: Diagnosis not present

## 2016-05-02 DIAGNOSIS — J9601 Acute respiratory failure with hypoxia: Secondary | ICD-10-CM | POA: Diagnosis not present

## 2016-05-02 DIAGNOSIS — J95811 Postprocedural pneumothorax: Secondary | ICD-10-CM

## 2016-05-02 DIAGNOSIS — G20 Parkinson's disease: Secondary | ICD-10-CM | POA: Diagnosis not present

## 2016-05-02 DIAGNOSIS — B370 Candidal stomatitis: Secondary | ICD-10-CM | POA: Diagnosis present

## 2016-05-02 DIAGNOSIS — R0603 Acute respiratory distress: Secondary | ICD-10-CM | POA: Diagnosis present

## 2016-05-02 DIAGNOSIS — Z9289 Personal history of other medical treatment: Secondary | ICD-10-CM

## 2016-05-02 DIAGNOSIS — G9340 Encephalopathy, unspecified: Secondary | ICD-10-CM

## 2016-05-02 DIAGNOSIS — I959 Hypotension, unspecified: Secondary | ICD-10-CM | POA: Diagnosis present

## 2016-05-02 DIAGNOSIS — Z978 Presence of other specified devices: Secondary | ICD-10-CM

## 2016-05-02 DIAGNOSIS — J9600 Acute respiratory failure, unspecified whether with hypoxia or hypercapnia: Secondary | ICD-10-CM | POA: Diagnosis present

## 2016-05-02 DIAGNOSIS — N390 Urinary tract infection, site not specified: Secondary | ICD-10-CM | POA: Diagnosis present

## 2016-05-02 DIAGNOSIS — R1319 Other dysphagia: Secondary | ICD-10-CM | POA: Diagnosis present

## 2016-05-02 DIAGNOSIS — R000 Tachycardia, unspecified: Secondary | ICD-10-CM | POA: Diagnosis present

## 2016-05-02 DIAGNOSIS — G20A1 Parkinson's disease without dyskinesia, without mention of fluctuations: Secondary | ICD-10-CM | POA: Diagnosis present

## 2016-05-02 DIAGNOSIS — Z87891 Personal history of nicotine dependence: Secondary | ICD-10-CM

## 2016-05-02 DIAGNOSIS — Z515 Encounter for palliative care: Secondary | ICD-10-CM | POA: Diagnosis present

## 2016-05-02 DIAGNOSIS — M4800 Spinal stenosis, site unspecified: Secondary | ICD-10-CM | POA: Diagnosis present

## 2016-05-02 DIAGNOSIS — R0602 Shortness of breath: Secondary | ICD-10-CM | POA: Diagnosis not present

## 2016-05-02 DIAGNOSIS — R739 Hyperglycemia, unspecified: Secondary | ICD-10-CM | POA: Diagnosis present

## 2016-05-02 DIAGNOSIS — A419 Sepsis, unspecified organism: Secondary | ICD-10-CM | POA: Diagnosis not present

## 2016-05-02 DIAGNOSIS — Z923 Personal history of irradiation: Secondary | ICD-10-CM

## 2016-05-02 DIAGNOSIS — R6510 Systemic inflammatory response syndrome (SIRS) of non-infectious origin without acute organ dysfunction: Secondary | ICD-10-CM | POA: Diagnosis present

## 2016-05-02 DIAGNOSIS — R0600 Dyspnea, unspecified: Secondary | ICD-10-CM

## 2016-05-02 DIAGNOSIS — G931 Anoxic brain damage, not elsewhere classified: Secondary | ICD-10-CM | POA: Diagnosis not present

## 2016-05-02 DIAGNOSIS — J690 Pneumonitis due to inhalation of food and vomit: Secondary | ICD-10-CM | POA: Diagnosis present

## 2016-05-02 DIAGNOSIS — Z66 Do not resuscitate: Secondary | ICD-10-CM | POA: Diagnosis present

## 2016-05-02 DIAGNOSIS — Z4659 Encounter for fitting and adjustment of other gastrointestinal appliance and device: Secondary | ICD-10-CM

## 2016-05-02 DIAGNOSIS — J939 Pneumothorax, unspecified: Secondary | ICD-10-CM

## 2016-05-02 DIAGNOSIS — Z8546 Personal history of malignant neoplasm of prostate: Secondary | ICD-10-CM

## 2016-05-02 DIAGNOSIS — E876 Hypokalemia: Secondary | ICD-10-CM | POA: Diagnosis present

## 2016-05-02 DIAGNOSIS — L8990 Pressure ulcer of unspecified site, unspecified stage: Secondary | ICD-10-CM | POA: Insufficient documentation

## 2016-05-02 HISTORY — DX: Acute respiratory failure, unspecified whether with hypoxia or hypercapnia: J96.00

## 2016-05-02 LAB — COMPREHENSIVE METABOLIC PANEL
ALT: 5 U/L — AB (ref 17–63)
AST: 12 U/L — AB (ref 15–41)
Albumin: 2.5 g/dL — ABNORMAL LOW (ref 3.5–5.0)
Alkaline Phosphatase: 57 U/L (ref 38–126)
Anion gap: 8 (ref 5–15)
BUN: 16 mg/dL (ref 6–20)
CHLORIDE: 110 mmol/L (ref 101–111)
CO2: 21 mmol/L — AB (ref 22–32)
CREATININE: 0.82 mg/dL (ref 0.61–1.24)
Calcium: 7 mg/dL — ABNORMAL LOW (ref 8.9–10.3)
GFR calc Af Amer: >60 mL/min (ref >60)
GLUCOSE: 109 mg/dL — AB (ref 65–99)
Potassium: 3.2 mmol/L — ABNORMAL LOW (ref 3.5–5.1)
Sodium: 139 mmol/L (ref 135–145)
Total Bilirubin: 1.3 mg/dL — ABNORMAL HIGH (ref 0.3–1.2)
Total Protein: 5.4 g/dL — ABNORMAL LOW (ref 6.5–8.1)

## 2016-05-02 LAB — I-STAT CG4 LACTIC ACID, ED
LACTIC ACID, VENOUS: 2.15 mmol/L — AB (ref 0.5–2.0)
Lactic Acid, Venous: 3.77 mmol/L (ref 0.5–2.0)

## 2016-05-02 LAB — URINALYSIS, ROUTINE W REFLEX MICROSCOPIC
BILIRUBIN URINE: NEGATIVE
GLUCOSE, UA: NEGATIVE mg/dL
HGB URINE DIPSTICK: NEGATIVE
Ketones, ur: >80 mg/dL — AB
Nitrite: NEGATIVE
PH: 5.5 (ref 5.0–8.0)
Protein, ur: NEGATIVE mg/dL
SPECIFIC GRAVITY, URINE: 1.031 — AB (ref 1.005–1.030)

## 2016-05-02 LAB — CBC WITH DIFFERENTIAL/PLATELET
BASOS ABS: 0 10*3/uL (ref 0.0–0.1)
Basophils Relative: 0 %
Eosinophils Absolute: 0 10*3/uL (ref 0.0–0.7)
Eosinophils Relative: 0 %
HEMATOCRIT: 43.7 % (ref 39.0–52.0)
Hemoglobin: 14.1 g/dL (ref 13.0–17.0)
LYMPHS PCT: 11 %
Lymphs Abs: 2.1 10*3/uL (ref 0.7–4.0)
MCH: 29.4 pg (ref 26.0–34.0)
MCHC: 32.3 g/dL (ref 30.0–36.0)
MCV: 91 fL (ref 78.0–100.0)
MONO ABS: 1.2 10*3/uL — AB (ref 0.1–1.0)
MONOS PCT: 6 %
NEUTROS ABS: 15.7 10*3/uL — AB (ref 1.7–7.7)
Neutrophils Relative %: 83 %
Platelets: 141 10*3/uL — ABNORMAL LOW (ref 150–400)
RBC: 4.8 MIL/uL (ref 4.22–5.81)
RDW: 15 % (ref 11.5–15.5)
WBC: 19 10*3/uL — ABNORMAL HIGH (ref 4.0–10.5)

## 2016-05-02 LAB — I-STAT TROPONIN, ED: Troponin i, poc: 0.05 ng/mL (ref 0.00–0.08)

## 2016-05-02 LAB — URINE MICROSCOPIC-ADD ON

## 2016-05-02 LAB — I-STAT ARTERIAL BLOOD GAS, ED
Acid-base deficit: 2 mmol/L (ref 0.0–2.0)
Bicarbonate: 22.6 mEq/L (ref 20.0–24.0)
O2 Saturation: 98 %
PCO2 ART: 37.4 mmHg (ref 35.0–45.0)
TCO2: 24 mmol/L (ref 0–100)
pH, Arterial: 7.392 (ref 7.350–7.450)
pO2, Arterial: 117 mmHg — ABNORMAL HIGH (ref 80.0–100.0)

## 2016-05-02 LAB — CULTURE, BLOOD (ROUTINE X 2)
Culture: NO GROWTH
Culture: NO GROWTH

## 2016-05-02 LAB — MAGNESIUM: MAGNESIUM: 1.9 mg/dL (ref 1.7–2.4)

## 2016-05-02 LAB — BRAIN NATRIURETIC PEPTIDE: B NATRIURETIC PEPTIDE 5: 284.8 pg/mL — AB (ref 0.0–100.0)

## 2016-05-02 LAB — TSH: TSH: 1.34 u[IU]/mL (ref 0.350–4.500)

## 2016-05-02 MED ORDER — ONDANSETRON HCL 4 MG/2ML IJ SOLN: 4.0000 mg | Freq: Four times a day (QID) | INTRAMUSCULAR | Status: DC | PRN

## 2016-05-02 MED ORDER — PIPERACILLIN-TAZOBACTAM 3.375 G IVPB: 3.3750 g | Freq: Three times a day (TID) | INTRAVENOUS | Status: DC

## 2016-05-02 MED ORDER — SODIUM CHLORIDE 0.9 % IV BOLUS (SEPSIS)
1000.0000 mL | Freq: Once | INTRAVENOUS | Status: AC
  Administered 2016-05-02: 1000 mL via INTRAVENOUS

## 2016-05-02 MED ORDER — PIPERACILLIN-TAZOBACTAM 3.375 G IVPB 30 MIN
3.3750 g | Freq: Once | INTRAVENOUS | Status: DC
  Filled 2016-05-02: qty 50

## 2016-05-02 MED ORDER — ACETAMINOPHEN 650 MG RE SUPP: 650.0000 mg | Freq: Four times a day (QID) | RECTAL | Status: DC | PRN

## 2016-05-02 MED ORDER — FLUCONAZOLE 40 MG/ML PO SUSR
100.0000 mg | Freq: Every day | ORAL | Status: DC
  Administered 2016-05-04 – 2016-05-06 (×3): 100 mg via ORAL
  Filled 2016-05-02: qty 10
  Filled 2016-05-02 (×3): qty 2.5

## 2016-05-02 MED ORDER — IPRATROPIUM-ALBUTEROL 0.5-2.5 (3) MG/3ML IN SOLN
3.0000 mL | Freq: Once | RESPIRATORY_TRACT | Status: AC
  Administered 2016-05-02: 3 mL via RESPIRATORY_TRACT
  Filled 2016-05-02: qty 3

## 2016-05-02 MED ORDER — PIPERACILLIN-TAZOBACTAM 3.375 G IVPB
3.3750 g | Freq: Three times a day (TID) | INTRAVENOUS | Status: DC
  Administered 2016-05-02 – 2016-05-06 (×10): 3.375 g via INTRAVENOUS
  Filled 2016-05-02 (×15): qty 50

## 2016-05-02 MED ORDER — POTASSIUM CHLORIDE IN NACL 20-0.9 MEQ/L-% IV SOLN
INTRAVENOUS | Status: DC
  Administered 2016-05-02: 18:00:00 via INTRAVENOUS
  Filled 2016-05-02: qty 1000

## 2016-05-02 MED ORDER — VANCOMYCIN HCL IN DEXTROSE 1-5 GM/200ML-% IV SOLN
1000.0000 mg | Freq: Once | INTRAVENOUS | Status: DC
  Administered 2016-05-02: 1000 mg via INTRAVENOUS

## 2016-05-02 MED ORDER — SODIUM CHLORIDE 0.9 % IV BOLUS (SEPSIS): 500.0000 mL | Freq: Once | INTRAVENOUS | Status: DC

## 2016-05-02 MED ORDER — VANCOMYCIN HCL 10 G IV SOLR
1500.0000 mg | Freq: Once | INTRAVENOUS | Status: DC
  Filled 2016-05-02: qty 1500

## 2016-05-02 MED ORDER — CARBIDOPA-LEVODOPA ER 25-100 MG PO TBCR
2.0000 | EXTENDED_RELEASE_TABLET | Freq: Four times a day (QID) | ORAL | Status: DC
  Administered 2016-05-02 – 2016-05-03 (×4): 2 via ORAL
  Filled 2016-05-02 (×6): qty 2

## 2016-05-02 MED ORDER — VANCOMYCIN HCL IN DEXTROSE 1-5 GM/200ML-% IV SOLN
1000.0000 mg | Freq: Once | INTRAVENOUS | Status: DC
  Filled 2016-05-02: qty 200

## 2016-05-02 MED ORDER — ACETAMINOPHEN 325 MG PO TABS: 650.0000 mg | ORAL_TABLET | Freq: Four times a day (QID) | ORAL | Status: DC | PRN

## 2016-05-02 MED ORDER — SODIUM CHLORIDE 0.9 % IV SOLN: INTRAVENOUS | Status: DC

## 2016-05-02 MED ORDER — PIPERACILLIN-TAZOBACTAM 3.375 G IVPB 30 MIN
3.3750 g | Freq: Once | INTRAVENOUS | Status: AC
  Administered 2016-05-02: 3.375 g via INTRAVENOUS
  Filled 2016-05-02: qty 50

## 2016-05-02 MED ORDER — SODIUM CHLORIDE 0.9 % IV SOLN
3.0000 g | Freq: Three times a day (TID) | INTRAVENOUS | Status: DC
  Filled 2016-05-02 (×3): qty 3

## 2016-05-02 MED ORDER — ONDANSETRON HCL 4 MG PO TABS: 4.0000 mg | ORAL_TABLET | Freq: Four times a day (QID) | ORAL | Status: DC | PRN

## 2016-05-02 MED ORDER — BISACODYL 10 MG RE SUPP: 10.0000 mg | Freq: Every day | RECTAL | Status: DC | PRN

## 2016-05-02 MED ORDER — ENOXAPARIN SODIUM 40 MG/0.4ML ~~LOC~~ SOLN
40.0000 mg | SUBCUTANEOUS | Status: DC
  Administered 2016-05-02 – 2016-05-05 (×4): 40 mg via SUBCUTANEOUS
  Filled 2016-05-02 (×4): qty 0.4

## 2016-05-02 MED ORDER — VANCOMYCIN HCL IN DEXTROSE 1-5 GM/200ML-% IV SOLN: 1000.0000 mg | Freq: Two times a day (BID) | INTRAVENOUS | Status: DC

## 2016-05-02 MED ORDER — VANCOMYCIN HCL IN DEXTROSE 1-5 GM/200ML-% IV SOLN
1000.0000 mg | Freq: Two times a day (BID) | INTRAVENOUS | Status: DC
  Administered 2016-05-03 – 2016-05-05 (×4): 1000 mg via INTRAVENOUS
  Filled 2016-05-02 (×6): qty 200

## 2016-05-02 MED ORDER — SODIUM CHLORIDE 0.9 % IV SOLN
500.0000 mg | Freq: Once | INTRAVENOUS | Status: DC
  Filled 2016-05-02: qty 500

## 2016-05-02 MED ORDER — TRIAMCINOLONE ACETONIDE 0.1 % EX CREA
1.0000 "application " | TOPICAL_CREAM | Freq: Two times a day (BID) | CUTANEOUS | Status: DC
  Administered 2016-05-02 – 2016-05-05 (×6): 1 via TOPICAL
  Filled 2016-05-02 (×2): qty 15

## 2016-05-02 NOTE — ED Notes (Signed)
Pt having very thick secretions to back of throat. Pt deep suctioned with large amount of white thick secretions. . 98% on 2L [place]

## 2016-05-02 NOTE — ED Notes (Signed)
Family at bedside. 

## 2016-05-02 NOTE — Progress Notes (Signed)
While rounding, nurse asked for me to check on patient in trauma C. Introduced myself to patient and his wife. Patient was awake and aware but unable to talk as he was having assistance with breathing. Wife was in the room with patient.  Offered and provided ministry of hospitality to the wife. (Coffee).  Offered prayer of peace and order. Will follow up as needed.  Graciela Husbands 2:47 PM    05/02/16 1413  Clinical Encounter Type  Visited With Patient and family together  Visit Type Initial;Spiritual support;ED;Trauma  Referral From Nurse  Spiritual Encounters  Spiritual Needs Prayer;Emotional  Advance Directives (For Healthcare)  Does patient have an advance directive? Yes  Type of Paramedic of [place]

## 2016-05-02 NOTE — H&P (Signed)
[facility] History and Physical  Raymond Valdez [id number] DOB: 04-Jul-1929 DOA: 05/02/2016  Referring physician: Laneta Simmers PCP: [name], MD   Chief Complaint: shortness of breath  HPI: Raymond Valdez is a 82 y.o. male with a past medical history of Parkinson disease, hyperglycemia, actually degeneration, prostate cancer presents emergency department from home via EMS with a chief complaint of shortness of breath. Initial evaluation reveals acute respiratory failure on room air, tachycardia tachypnea.  Information is obtained from the wife he is at the bedside. Wife reports patient went to the emergency department 5 days ago due to shortness of breath as well as generalized weakness and cough. Was provided with nebulizers and discharged home. 2 days ago she noticed some coughing and clearing of the throat during meals. His morning she could hear a lot of bubble secretions in the back of his throat and he was struggling to breathe. EMS was called. According to chart BiPAP applied. There've been no complaints of chest pain palpitation headache dizziness syncope or near-syncope. No complaints of abdominal pain nausea vomiting dysuria hematuria frequency or urgency. Wife did report last month "Parkinson's medication increased". He also reports only being treated for thrush. His result his ability to swallow is worse than his baseline  In the emergency department max temp 99.9 heart rate 122 and saturation level 97% on 2 L. Provided with vancomycin and Zosyn  Review of Systems:  10 point review of systems complete and all systems are negative except as indicated in the history of present illness  Past Medical History  Diagnosis Date  . Macular degeneration   . History of prostate cancer   . Infection, bartonella   . Spinal stenosis   . [place]'s [place] 05-2012  . Cancer ([place])   . Hyperglycemia   . Acute respiratory failure [facility])    Past Surgical History    Procedure Laterality Date  . Appendectomy    . Tonsillectomy    . Prostate surgery  1998    surgery and then radiation 7 years later in 2005  . Colonoscopy with propofol N/A 03/03/2013    Procedure: COLONOSCOPY WITH PROPOFOL;  Surgeon: Garlan Fair, MD;  Location: [facility];  Service: Endoscopy;  Laterality: N/A;   Social History:  reports that he quit smoking about 45 years ago. He has never used smokeless tobacco. He reports that he drinks alcohol. He reports that he does not use illicit drugs. He is retired Animal nutritionist. He lives at home with his wife has done so for the last 55 years. He uses a walker for ambulation he is 1 person assist with ADLs he is able to make his wants and needs no known Allergies  Allergen Reactions  . Ciprofloxacin Rash  . Sulfa Antibiotics Itching and Rash  . Sulfa Drugs Cross Reactors Itching and Rash    Tribrisen, a sulfa potentiator, also causes reaction in patient, as does sulfa  . Benadryl [Diphenhydramine] Other (See Comments)    Per wife, TYLENOL PM exacerbated his Parkinson's Disease   . Tape Other (See Comments)    Most tape can tear the skin  . Tylenol [Acetaminophen] Other (See Comments)    Per wife, TYLENOL PM exacerbated his Parkinson's Disease     History reviewed. No pertinent family history. Family medical history reviewed and is noncontributory to the admission of this elderly gentleman  Prior to Admission medications   Medication Sig Start Date End Date Taking? Authorizing Provider  Aflibercept (EYLEA IO) Inject into the  eye See admin instructions. Rotate eyes; each new injection is given every 6-8 weeks   Yes Historical Provider, MD  albuterol (PROVENTIL HFA;VENTOLIN HFA) 108 (90 BASE) MCG/ACT inhaler Inhale 2 puffs into the lungs every 4 (four) hours as needed for wheezing or shortness of breath. 01/05/15  Yes Sherwood Gambler, MD  Artificial Tear Solution (GENTEAL TEARS) 0.1-0.2-0.3 % SOLN Place 1-2 drops into both eyes daily as  needed (for dry eyes).   Yes Historical Provider, MD  Carbidopa-Levodopa ER (SINEMET CR) 25-100 MG tablet controlled release Take 2 tablets by mouth 4 (four) times daily.   Yes Historical Provider, MD  fluconazole (DIFLUCAN) 10 MG/ML suspension Swish and swallow 10 ml's by mouth once daily for 10 days 05/01/16  Yes Historical Provider, MD  fluticasone (FLONASE) 50 MCG/ACT nasal spray Place 1-2 sprays into both nostrils daily as needed for allergies or rhinitis.   Yes Historical Provider, MD  hydrocortisone cream 0.5 % Apply 1 application topically 2 (two) times daily as needed for itching.   Yes Historical Provider, MD  triamcinolone cream (KENALOG) 0.1 % Apply 1 application topically 2 (two) times daily.   Yes Historical Provider, MD  sildenafil (VIAGRA) 100 MG tablet Take 1 tablet (100 mg total) by mouth daily as needed for erectile dysfunction. Patient not taking: Reported on 01/05/2015 07/31/13   Vernie Shanks, MD  tadalafil (CIALIS) 5 MG tablet Take 1 tablet (5 mg total) by mouth daily as needed for erectile dysfunction. Patient not taking: Reported on 01/05/2015 03/19/14   Vernie Shanks, MD  trimethoprim (TRIMPEX) 100 MG tablet Take 100 mg by mouth daily. Ongoing antibiotic    Historical Provider, MD   Physical Exam: Filed Vitals:   05/02/16 1545 05/02/16 1630 05/02/16 1703 05/02/16 1715  BP: 94/61 122/67 104/66 107/67  Pulse: 111 106 101 100  Temp:      TempSrc:      Resp: 20 19 33 23  Height:      Weight:      SpO2: 98% 95% 100% 95%    Wt Readings from Last 3 Encounters:  05/02/16 79.379 kg (175 lb)  12/19/13 85.276 kg (188 lb)  11/12/13 83.462 kg (184 lb)    General:  Appears calm and comfortable, face somewhat flushed Eyes: PERRL, normal lids, irises & conjunctiva ENT: grossly normal hearing, talking with thick white exudate erythema warfarin at Neck: no LAD, masses or thyromegaly Cardiovascular: Tachycardic but regular, no m/r/g. No LE edema.  Respiratory: Mild increased work  of breathing. Audible secretions in the back of his throat which cleared nicely after persistent oral suctioning Breath sounds coarse throughout no crackles  Abdomen: soft, ntnd sluggish bowel sounds Skin: no rash or induration seen on limited exam Musculoskeletal: grossly poor  tone BUE/BLE, joints without swelling/erythema Psychiatric: grossly normal mood and affect, speech fluent and appropriate Neurologic: grossly non-focal. Speech difficult to understand follows commands bilateral grip 4 out of 4           Labs on Admission:  Basic Metabolic Panel:  Recent Labs Lab 04/27/16 1132 05/02/16 1419  NA 139 139  K 4.6 3.2*  CL 105 110  CO2 27 21*  GLUCOSE 89 109*  BUN 23* 16  CREATININE 0.84 0.82  CALCIUM 8.7* 7.0*   Liver Function Tests:  Recent Labs Lab 04/27/16 1132 05/02/16 1419  AST 17 12*  ALT 7* 5*  ALKPHOS 68 57  BILITOT 0.8 1.3*  PROT 6.8 5.4*  ALBUMIN 3.6 2.5*   No results  for input(s): LIPASE, AMYLASE in the last 168 hours. No results for input(s): AMMONIA in the last 168 hours. CBC:  Recent Labs Lab 04/27/16 1132 05/02/16 1419  WBC 10.5 19.0*  NEUTROABS 6.7 15.7*  HGB 15.5 14.1  HCT 47.4 43.7  MCV 91.7 91.0  PLT 158 141*   Cardiac Enzymes: No results for input(s): CKTOTAL, CKMB, CKMBINDEX, TROPONINI in the last 168 hours.  BNP (last 3 results)  Recent Labs  05/02/16 1419  BNP 284.8*    ProBNP (last 3 results) No results for input(s): PROBNP in the last 8760 hours.  CBG:  Recent Labs Lab 04/27/16 1050  GLUCAP 85    Radiological Exams on Admission: Dg Chest 2 View  05/02/2016  CLINICAL DATA:  Shortness of breath and cough for 1 day. EXAM: CHEST  2 VIEW COMPARISON:  April 27, 2016 FINDINGS: The heart size and mediastinal contours are stable. The heart size is mildly enlarged. The aorta is tortuous. There is no focal infiltrate, pulmonary edema, or pleural effusion. The visualized skeletal structures are stable. IMPRESSION: No active  cardiopulmonary disease. Electronically Signed   By: Abelardo Diesel M.D.   On: 05/02/2016 15:41   Ct Head Wo Contrast  05/02/2016  CLINICAL DATA:  Altered mental status, poor historian, history Parkinson's EXAM: CT HEAD WITHOUT CONTRAST TECHNIQUE: Contiguous axial images were obtained from the base of the skull through the vertex without intravenous contrast. COMPARISON:  11/10/2015 FINDINGS: Generalized atrophy. Normal ventricular morphology. No midline shift or mass effect. Otherwise normal appearance of brain parenchyma. No intracranial hemorrhage, mass lesion, evidence acute infarction, or extra-axial fluid collection. Paranasal sinuses and mastoid air cells clear. Atherosclerotic calcifications at the carotid siphons. No acute osseous findings. IMPRESSION: Generalized atrophy. No acute intracranial abnormalities. Electronically Signed   By: Lavonia Dana M.D.   On: 05/02/2016 17:08    EKG: Independently reviewed. Sinus tachycardia Atrial premature complex Right bundle branch block Since last tracing rate faster Otherwise no significant change  Assessment/Plan Principal Problem:   Acute respiratory failure (HCC) Active Problems:   Macular degeneration   Parkinson's disease (HCC)   SIRS (systemic inflammatory response syndrome) (HCC)   Hypokalemia   Elevated lactic acid level   Tachycardia   Dysphagia   Thrush, oral  #1. Acute respiratory failure likely related aspiration secondary to worsening dysphagia in the setting of worsening Parkinson's complicated by acute oral thrush.  EMS applied BIPAP at site. He was provided with nebulizers and improved. The time of admission oxygen saturation level 93% on 2 L. ABG within the limits of normal. BNP 284. Also some concern for possible infectious process related to aspiration. White count 19 max temp 99 Chest x-ray no active cardiopulmonary disease. -Admit to  -Continue nebulizers -Continue oral suctioning -Speech therapy for evaluation -GI  consult. -Continue oxygen supplementation -Change antibiotics to Unasyn -Repeat chest x-ray in the morning -Wean oxygen as able  #2. Sirs. Leukocytosis, tachycardia, tachypnea. Some concern for pneumonia given #1. Initial chest x-ray without acute cardiopulmonary disease. Urinalysis pending. Lactic acid 2.15. Tachycardia improving with IV fluids, tachypnea improved with nebs and oxygen supplementation -Repeat chest x-ray in the morning -Change antibiotics to Unasyn -Follow urinalysis -Follow blood cultures -Follow lactic acid -Continue IV fluids  #3. Thrush, oral. Home medications include Magic mouthwash -We will continue home meds  #4. Parkinson's. Appears stable at baseline. Wife reports increase in medications last month. Ambulates with a walker -continue home meds -physical therapy  5. Dysphagia. Somewhat chronic but worsening related to Parkinson's. Family  reports recent appointment with gastroenterology for workup. Wife also states she and patient would "say yes to tube feeding". Discussed with GI who will consult tomorrow. -Modified barium swallow per GI -DG esophagus per GI -Speech therapy eval. -Nothing by mouth except meds.  6. Hypokalemia. Mild. Likely related to decreased oral intake. -Replete -Check magnesium level -Recheck in the morning  Dr Penelope Coop  Code Status: limited DVT Prophylaxis: Family Communication: wife at bedside Disposition Plan: home when ready  Time spent: 76 minutes  [place]

## 2016-05-02 NOTE — ED Provider Notes (Signed)
CSN: [id number]     Arrival date & time 05/02/16  1411 History   First MD Initiated Contact with Patient 05/02/16 1416     Chief Complaint  Patient presents with  . Shortness of Breath     (Consider location/radiation/quality/duration/timing/severity/associated sxs/prior Treatment) Patient is a 82 y.o. male presenting with shortness of breath. The history is provided by the spouse.  Shortness of Breath Severity:  Moderate Onset quality:  Gradual Duration:  2 days Timing:  Constant Progression:  Worsening Context: URI   Relieved by:  Nothing Worsened by:  Nothing tried Ineffective treatments:  Inhaler Associated symptoms: cough and sputum production   Associated symptoms: no fever and no vomiting   Risk factors comment:  Parkinsons   Past Medical History  Diagnosis Date  . Macular degeneration   . History of prostate cancer   . Infection, bartonella   . Spinal stenosis   . Parkinson's disease ([place]) 05-2012  . Cancer ([place])   . Hyperglycemia    Past Surgical History  Procedure Laterality Date  . Appendectomy    . Tonsillectomy    . Prostate surgery  1998    surgery and then radiation 7 years later in 2005  . Colonoscopy with propofol N/A 03/03/2013    Procedure: COLONOSCOPY WITH PROPOFOL;  Surgeon: Garlan Fair, MD;  Location: [facility];  Service: Endoscopy;  Laterality: N/A;   History reviewed. No pertinent family history. Social History  Substance Use Topics  . Smoking status: Former Smoker -- 0.25 packs/day for 10 years    Quit date: 11/27/1970  . Smokeless tobacco: Never Used  . Alcohol Use: Yes    Review of Systems  Constitutional: Negative for fever.  Respiratory: Positive for cough, sputum production and shortness of breath.   Gastrointestinal: Negative for vomiting.  Neurological: Positive for speech difficulty (waxing and waning with mental status).  All other systems reviewed and are negative.     Allergies  Ciprofloxacin; Sulfa antibiotics;  Sulfa drugs cross reactors; Benadryl; Tape; and Tylenol  Home Medications   Prior to Admission medications   Medication Sig Start Date End Date Taking? Authorizing Provider  Aflibercept (EYLEA IO) Inject into the eye every 6 (six) weeks. Rotate eyes every 6 weeks   Yes Historical Provider, MD  albuterol (PROVENTIL HFA;VENTOLIN HFA) 108 (90 BASE) MCG/ACT inhaler Inhale 2 puffs into the lungs every 4 (four) hours as needed for wheezing or shortness of breath. 01/05/15   Sherwood Gambler, MD  Artificial Tear Solution (GENTEAL TEARS) 0.1-0.2-0.3 % SOLN Place 1-2 drops into both eyes daily as needed (for dry eyes).    Historical Provider, MD  beta carotene w/minerals (OCUVITE) tablet Take 1 tablet by mouth daily.    Historical Provider, MD  carbidopa-levodopa (SINEMET IR) 25-100 MG per tablet Take 2 tablets by mouth 4 (four) times daily.     Historical Provider, MD  fluconazole (DIFLUCAN) 10 MG/ML suspension Swish and swallow 10 ml's by mouth once daily for 10 days 05/01/16   Historical Provider, MD  hydrocortisone cream 0.5 % Apply 1 application topically 2 (two) times daily as needed for itching.    Historical Provider, MD  sildenafil (VIAGRA) 100 MG tablet Take 1 tablet (100 mg total) by mouth daily as needed for erectile dysfunction. Patient not taking: Reported on 01/05/2015 07/31/13   Vernie Shanks, MD  tadalafil (CIALIS) 5 MG tablet Take 1 tablet (5 mg total) by mouth daily as needed for erectile dysfunction. Patient not taking: Reported on 01/05/2015 03/19/14  Vernie Shanks, MD  triamcinolone cream (KENALOG) 0.1 % Apply 1 application topically 2 (two) times daily.    Historical Provider, MD  trimethoprim (TRIMPEX) 100 MG tablet Take 100 mg by mouth daily. Ongoing antibiotic    Historical Provider, MD   BP 94/62 mmHg  Pulse 121  Temp(Src) 99.9 F (37.7 C) (Rectal)  Resp 23  Ht 5\' 8"  (1.727 m)  Wt 175 lb (79.379 kg)  BMI 26.61 kg/m2  SpO2 100% Physical Exam  Constitutional: He is oriented to  person, place, and time. He appears well-developed. He appears listless. He appears toxic. Face mask in place.  HENT:  Head: Normocephalic and atraumatic.  Eyes: Conjunctivae are normal.  Neck: Neck supple. No tracheal deviation present.  Cardiovascular: Regular rhythm and normal heart sounds.  Tachycardia present.   Pulmonary/Chest: Tachypnea noted. He is in respiratory distress. He has rhonchi (diffuse with transmitted upper airway noise).  Weak, wet cough  Abdominal: Soft. He exhibits no distension.  Neurological: He is oriented to person, place, and time. He appears listless. GCS eye subscore is 4. GCS verbal subscore is 4. GCS motor subscore is 6.  Pt with weakness of diffuse facial muscles  Skin: Skin is warm and dry.  Psychiatric: He has a normal mood and affect.    ED Course  Procedures (including critical care time)  CRITICAL CARE Performed by: Leo Grosser Total critical care time: 30 minutes Critical care time was exclusive of separately billable procedures and treating other patients. Critical care was necessary to treat or prevent imminent or life-threatening deterioration. Critical care was time spent personally by me on the following activities: development of treatment plan with patient and/or surrogate as well as nursing, discussions with consultants, evaluation of patient's response to treatment, examination of patient, obtaining history from patient or surrogate, ordering and performing treatments and interventions, ordering and review of laboratory studies, ordering and review of radiographic studies, pulse oximetry and re-evaluation of patient's condition.  Emergency Focused Ultrasound Exam Limited Ultrasound of the Heart and Pericardium  Performed and interpreted by Dr. Laneta Simmers Indication: shortness of breath Multiple views of the heart, pericardium, and IVC are obtained with a multi frequency probe.  Findings: nml contractility, no anechoic fluid unable to  evaluate IVC Interpretation: nml ejection fraction, no pericardial effusion, study limited 2/2 poor windows Images archived electronically.  CPT Code: [id number]   Labs Review Labs Reviewed  CBC WITH DIFFERENTIAL/PLATELET - Abnormal; Notable for the following:    WBC 19.0 (*)    Platelets 141 (*)    Neutro Abs 15.7 (*)    Monocytes Absolute 1.2 (*)    All other components within normal limits  BRAIN NATRIURETIC PEPTIDE - Abnormal; Notable for the following:    B Natriuretic Peptide 284.8 (*)    All other components within normal limits  COMPREHENSIVE METABOLIC PANEL - Abnormal; Notable for the following:    Potassium 3.2 (*)    CO2 21 (*)    Glucose, Bld 109 (*)    Calcium 7.0 (*)    Total Protein 5.4 (*)    Albumin 2.5 (*)    AST 12 (*)    ALT 5 (*)    Total Bilirubin 1.3 (*)    All other components within normal limits  I-STAT CG4 LACTIC ACID, ED - Abnormal; Notable for the following:    Lactic Acid, Venous 2.15 (*)    All other components within normal limits  I-STAT ARTERIAL BLOOD GAS, ED - Abnormal; Notable for the following:  pO2, Arterial 117.0 (*)    All other components within normal limits  URINE CULTURE  CULTURE, BLOOD (ROUTINE X 2)  CULTURE, BLOOD (ROUTINE X 2)  URINALYSIS, ROUTINE W REFLEX MICROSCOPIC (NOT AT [facility])  TSH  BASIC METABOLIC PANEL  CBC  MAGNESIUM  I-STAT TROPOININ, ED  I-STAT CG4 LACTIC ACID, ED    Imaging Review Dg Chest 2 View  05/02/2016  CLINICAL DATA:  Shortness of breath and cough for 1 day. EXAM: CHEST  2 VIEW COMPARISON:  April 27, 2016 FINDINGS: The heart size and mediastinal contours are stable. The heart size is mildly enlarged. The aorta is tortuous. There is no focal infiltrate, pulmonary edema, or pleural effusion. The visualized skeletal structures are stable. IMPRESSION: No active cardiopulmonary disease. Electronically Signed   By: Abelardo Diesel M.D.   On: 05/02/2016 15:41   Ct Head Wo Contrast  05/02/2016  CLINICAL DATA:  Altered  mental status, poor historian, history Parkinson's EXAM: CT HEAD WITHOUT CONTRAST TECHNIQUE: Contiguous axial images were obtained from the base of the skull through the vertex without intravenous contrast. COMPARISON:  11/10/2015 FINDINGS: Generalized atrophy. Normal ventricular morphology. No midline shift or mass effect. Otherwise normal appearance of brain parenchyma. No intracranial hemorrhage, mass lesion, evidence acute infarction, or extra-axial fluid collection. Paranasal sinuses and mastoid air cells clear. Atherosclerotic calcifications at the carotid siphons. No acute osseous findings. IMPRESSION: Generalized atrophy. No acute intracranial abnormalities. Electronically Signed   By: Lavonia Dana M.D.   On: 05/02/2016 17:08   I have personally reviewed and evaluated these images and lab results as part of my medical decision-making.   EKG Interpretation   Date/Time:  Tuesday May 02 2016 14:16:20 EDT Ventricular Rate:  127 PR Interval:  105 QRS Duration: 152 QT Interval:  331 QTC Calculation: 481 R Axis:     Text Interpretation:  Sinus tachycardia Atrial premature complex Right  bundle branch block Since last tracing rate faster Otherwise no  significant change Confirmed by Reegan Mctighe MD, Quillian Quince [id number]) on 05/02/2016  2:31:30 PM      MDM   Final diagnoses:  SIRS (systemic inflammatory response syndrome) (HCC)  Loss of appetite  Encephalopathy acute  Acute respiratory failure with hypoxemia ([place])    82 y.o. male presents with gurgling cough, increased respiratory difficulty over the last 2 days. Per home health aides refused to eat or drink today, appeared very weak with slurring of speech. Tachycardic and tachypneic on arrival, showing SIRS without definitive source identified but will treat as presumed sepsis 2/2 respiratory illness. Will continue 30 cc/kg fluid resuscitation and broad spectrum antibiotics. On BiPap on arrival for respiratory support as he was apparently slumped over  with labored weak respirations on arrival of EMS. Able to wean to nasal cannula with large amount of respiratory secretions. Hospitalist was consulted for admission and will see the patient in the emergency department.  Discussed code status with Pt who states he would like to be intubated if necessary but does not wish to receive compressions in case of cardiac arrest.     Leo Grosser, MD 05/02/16 1736

## 2016-05-02 NOTE — ED Notes (Signed)
Report called to 2C 

## 2016-05-02 NOTE — ED Notes (Signed)
Pt transported to xray 

## 2016-05-02 NOTE — Progress Notes (Signed)
Pt wore BIPAP for brief period. ABG results normal. No distress noted. BIPAP removed and placed on 3 L [place]. Will cont to monitor

## 2016-05-02 NOTE — Progress Notes (Addendum)
Pharmacy Antibiotic Note  Raymond Valdez is a 82 y.o. male admitted on 05/02/2016 with sepsis.  Pharmacy has been consulted for vancomycin and zosyn dosing. Patient received vancomycin 1500mg  load in the ED.  Plan: Vancomycin 1000 IV every 12 hours.  Goal trough 15-20 mcg/mL. Zosyn 3.375g IV q8h (4 hour infusion).  Daily CBC Follow renal function and culture results Vancomycin trough once steady state reached  Height: 5\' 8"  (172.7 cm) Weight: 175 lb (79.379 kg) IBW/kg (Calculated) : 68.4  Temp (24hrs), Avg:99.7 F (37.6 C), Min:99.5 F (37.5 C), Max:99.9 F (37.7 C)   Recent Labs Lab 04/27/16 1132 04/27/16 1143 05/02/16 1419 05/02/16 1458  WBC 10.5  --  19.0*  --   CREATININE 0.84  --  0.82  --   LATICACIDVEN  --  1.84  --  2.15*    Estimated Creatinine Clearance: 63.7 mL/min (by C-G formula based on Cr of 0.82).    Allergies  Allergen Reactions  . Ciprofloxacin Rash  . Sulfa Antibiotics Itching and Rash  . Sulfa Drugs Cross Reactors Itching and Rash    Tribrisen, a sulfa potentiator, also causes reaction in patient, as does sulfa  . Benadryl [Diphenhydramine] Other (See Comments)    Per wife, TYLENOL PM exacerbated his Parkinson's Disease   . Tape Other (See Comments)    Most tape can tear the skin  . Tylenol [Acetaminophen] Other (See Comments)    Per wife, TYLENOL PM exacerbated his Parkinson's Disease     Antimicrobials this admission: 6/6 vanc >>  6/6 zosyn >>   Microbiology results: 6/1 BCx:  6/1 UCx:     Thank you for allowing pharmacy to be a part of this patient's care.  Melburn Popper, PharmD Clinical Pharmacy Resident Pager: [phone] 05/02/2016 3:57 PM  Addendum: antibiotics changed from vancomycin and zosyn to Unasyn.  Plan: Unasyn 3 gram every 8 hours Daily CBC Follow renal function and culture results

## 2016-05-02 NOTE — ED Notes (Signed)
Per EMS SOB since this morning, audible wheezing, and productive cough. Pt on CPAP upon arrival to ED.

## 2016-05-02 NOTE — ED Notes (Signed)
1,000 Vancomycin started. pharmacy mixing another 500 mg

## 2016-05-03 ENCOUNTER — Ambulatory Visit: Payer: Medicare Other | Admitting: Diagnostic Neuroimaging

## 2016-05-03 ENCOUNTER — Observation Stay (HOSPITAL_COMMUNITY): Payer: Medicare Other

## 2016-05-03 ENCOUNTER — Observation Stay (HOSPITAL_COMMUNITY): Payer: Medicare Other | Admitting: Critical Care Medicine

## 2016-05-03 ENCOUNTER — Inpatient Hospital Stay (HOSPITAL_COMMUNITY): Payer: Medicare Other

## 2016-05-03 DIAGNOSIS — M4800 Spinal stenosis, site unspecified: Secondary | ICD-10-CM | POA: Diagnosis present

## 2016-05-03 DIAGNOSIS — R1310 Dysphagia, unspecified: Secondary | ICD-10-CM

## 2016-05-03 DIAGNOSIS — B370 Candidal stomatitis: Secondary | ICD-10-CM | POA: Diagnosis present

## 2016-05-03 DIAGNOSIS — R0603 Acute respiratory distress: Secondary | ICD-10-CM | POA: Diagnosis present

## 2016-05-03 DIAGNOSIS — Z515 Encounter for palliative care: Secondary | ICD-10-CM | POA: Diagnosis not present

## 2016-05-03 DIAGNOSIS — Z66 Do not resuscitate: Secondary | ICD-10-CM | POA: Diagnosis present

## 2016-05-03 DIAGNOSIS — I959 Hypotension, unspecified: Secondary | ICD-10-CM | POA: Diagnosis present

## 2016-05-03 DIAGNOSIS — Z87891 Personal history of nicotine dependence: Secondary | ICD-10-CM | POA: Diagnosis not present

## 2016-05-03 DIAGNOSIS — R0602 Shortness of breath: Secondary | ICD-10-CM | POA: Diagnosis present

## 2016-05-03 DIAGNOSIS — R1319 Other dysphagia: Secondary | ICD-10-CM | POA: Diagnosis present

## 2016-05-03 DIAGNOSIS — N390 Urinary tract infection, site not specified: Secondary | ICD-10-CM | POA: Diagnosis present

## 2016-05-03 DIAGNOSIS — A419 Sepsis, unspecified organism: Secondary | ICD-10-CM | POA: Diagnosis present

## 2016-05-03 DIAGNOSIS — G931 Anoxic brain damage, not elsewhere classified: Secondary | ICD-10-CM | POA: Diagnosis not present

## 2016-05-03 DIAGNOSIS — J939 Pneumothorax, unspecified: Secondary | ICD-10-CM

## 2016-05-03 DIAGNOSIS — J9601 Acute respiratory failure with hypoxia: Secondary | ICD-10-CM | POA: Diagnosis present

## 2016-05-03 DIAGNOSIS — J690 Pneumonitis due to inhalation of food and vomit: Secondary | ICD-10-CM

## 2016-05-03 DIAGNOSIS — I469 Cardiac arrest, cause unspecified: Secondary | ICD-10-CM | POA: Diagnosis not present

## 2016-05-03 DIAGNOSIS — Z7189 Other specified counseling: Secondary | ICD-10-CM | POA: Diagnosis not present

## 2016-05-03 DIAGNOSIS — E876 Hypokalemia: Secondary | ICD-10-CM | POA: Diagnosis present

## 2016-05-03 DIAGNOSIS — R739 Hyperglycemia, unspecified: Secondary | ICD-10-CM | POA: Diagnosis present

## 2016-05-03 DIAGNOSIS — G20 Parkinson's disease: Secondary | ICD-10-CM | POA: Diagnosis present

## 2016-05-03 DIAGNOSIS — Z923 Personal history of irradiation: Secondary | ICD-10-CM | POA: Diagnosis not present

## 2016-05-03 DIAGNOSIS — L8990 Pressure ulcer of unspecified site, unspecified stage: Secondary | ICD-10-CM | POA: Insufficient documentation

## 2016-05-03 DIAGNOSIS — Z79899 Other long term (current) drug therapy: Secondary | ICD-10-CM | POA: Diagnosis not present

## 2016-05-03 DIAGNOSIS — Z8546 Personal history of malignant neoplasm of prostate: Secondary | ICD-10-CM | POA: Diagnosis not present

## 2016-05-03 DIAGNOSIS — J9600 Acute respiratory failure, unspecified whether with hypoxia or hypercapnia: Secondary | ICD-10-CM | POA: Diagnosis not present

## 2016-05-03 DIAGNOSIS — H3530 Unspecified macular degeneration: Secondary | ICD-10-CM | POA: Diagnosis present

## 2016-05-03 LAB — BASIC METABOLIC PANEL
ANION GAP: 7 (ref 5–15)
BUN: 16 mg/dL (ref 6–20)
CHLORIDE: 106 mmol/L (ref 101–111)
CO2: 25 mmol/L (ref 22–32)
Calcium: 7.9 mg/dL — ABNORMAL LOW (ref 8.9–10.3)
Creatinine, Ser: 0.66 mg/dL (ref 0.61–1.24)
GFR calc non Af Amer: >60 mL/min (ref >60)
GLUCOSE: 124 mg/dL — AB (ref 65–99)
POTASSIUM: 4 mmol/L (ref 3.5–5.1)
Sodium: 138 mmol/L (ref 135–145)

## 2016-05-03 LAB — LACTIC ACID, PLASMA
LACTIC ACID, VENOUS: 3.3 mmol/L — AB (ref 0.5–2.0)
Lactic Acid, Venous: 2.2 mmol/L (ref 0.5–2.0)
Lactic Acid, Venous: 3.6 mmol/L (ref 0.5–2.0)
Lactic Acid, Venous: 1.6 mmol/L (ref 0.5–2.0)

## 2016-05-03 LAB — MRSA PCR SCREENING: MRSA by PCR: NEGATIVE

## 2016-05-03 LAB — POCT I-STAT 3, ART BLOOD GAS (G3+)
ACID-BASE DEFICIT: 4 mmol/L — AB (ref 0.0–2.0)
BICARBONATE: 23 meq/L (ref 20.0–24.0)
O2 Saturation: 100 %
TCO2: 24 mmol/L (ref 0–100)
pCO2 arterial: 50.1 mmHg — ABNORMAL HIGH (ref 35.0–45.0)
pH, Arterial: 7.269 — ABNORMAL LOW (ref 7.350–7.450)
pO2, Arterial: 257 mmHg — ABNORMAL HIGH (ref 80.0–100.0)

## 2016-05-03 LAB — URINE CULTURE

## 2016-05-03 LAB — CBC
HEMATOCRIT: 42.6 % (ref 39.0–52.0)
HEMOGLOBIN: 13.1 g/dL (ref 13.0–17.0)
MCH: 28.4 pg (ref 26.0–34.0)
MCHC: 30.8 g/dL (ref 30.0–36.0)
MCV: 92.4 fL (ref 78.0–100.0)
Platelets: 135 10*3/uL — ABNORMAL LOW (ref 150–400)
RBC: 4.61 MIL/uL (ref 4.22–5.81)
RDW: 15.4 % (ref 11.5–15.5)
WBC: 14.9 10*3/uL — ABNORMAL HIGH (ref 4.0–10.5)

## 2016-05-03 LAB — GLUCOSE, CAPILLARY: Glucose-Capillary: 131 mg/dL — ABNORMAL HIGH (ref 65–99)

## 2016-05-03 MED ORDER — SODIUM CHLORIDE 0.9 % IV BOLUS (SEPSIS)
1000.0000 mL | Freq: Once | INTRAVENOUS | Status: AC
  Administered 2016-05-03: 1000 mL via INTRAVENOUS

## 2016-05-03 MED ORDER — CHLORHEXIDINE GLUCONATE 0.12% ORAL RINSE (MEDLINE KIT)
15.0000 mL | Freq: Two times a day (BID) | OROMUCOSAL | Status: DC
  Administered 2016-05-03 – 2016-05-06 (×6): 15 mL via OROMUCOSAL

## 2016-05-03 MED ORDER — FENTANYL CITRATE (PF) 100 MCG/2ML IJ SOLN: 50.0000 ug | Freq: Once | INTRAMUSCULAR | Status: DC

## 2016-05-03 MED ORDER — MIDAZOLAM HCL 2 MG/2ML IJ SOLN: 1.0000 mg | INTRAMUSCULAR | Status: DC | PRN

## 2016-05-03 MED ORDER — PHENYLEPHRINE HCL 10 MG/ML IJ SOLN
30.0000 ug/min | INTRAVENOUS | Status: DC
  Administered 2016-05-03: 30 ug/min via INTRAVENOUS
  Filled 2016-05-03: qty 1

## 2016-05-03 MED ORDER — MIDAZOLAM HCL 2 MG/2ML IJ SOLN
INTRAMUSCULAR | Status: AC
  Administered 2016-05-03: 2 mg
  Filled 2016-05-03: qty 4

## 2016-05-03 MED ORDER — FENTANYL BOLUS VIA INFUSION
25.0000 ug | INTRAVENOUS | Status: DC | PRN
  Filled 2016-05-03: qty 25

## 2016-05-03 MED ORDER — CHLORHEXIDINE GLUCONATE 0.12 % MT SOLN
15.0000 mL | Freq: Two times a day (BID) | OROMUCOSAL | Status: DC
Start: 2016-05-03 — End: 2016-05-03

## 2016-05-03 MED ORDER — SODIUM CHLORIDE 0.9 % IV SOLN
25.0000 ug/h | INTRAVENOUS | Status: DC
  Administered 2016-05-03 – 2016-05-05 (×2): 12.5 ug/h via INTRAVENOUS
  Filled 2016-05-03 (×2): qty 50

## 2016-05-03 MED ORDER — FAMOTIDINE IN NACL 20-0.9 MG/50ML-% IV SOLN
20.0000 mg | Freq: Two times a day (BID) | INTRAVENOUS | Status: DC
  Administered 2016-05-03 – 2016-05-06 (×6): 20 mg via INTRAVENOUS
  Filled 2016-05-03 (×7): qty 50

## 2016-05-03 MED ORDER — CETYLPYRIDINIUM CHLORIDE 0.05 % MT LIQD
7.0000 mL | Freq: Two times a day (BID) | OROMUCOSAL | Status: DC
  Administered 2016-05-03: 7 mL via OROMUCOSAL

## 2016-05-03 MED ORDER — SODIUM CHLORIDE 0.9 % IV SOLN
1000.0000 mL | Freq: Once | INTRAVENOUS | Status: AC
  Administered 2016-05-03: 1000 mL via INTRAVENOUS

## 2016-05-03 MED ORDER — ANTISEPTIC ORAL RINSE SOLUTION (CORINZ)
7.0000 mL | OROMUCOSAL | Status: DC
  Administered 2016-05-03 – 2016-05-06 (×26): 7 mL via OROMUCOSAL

## 2016-05-03 MED ORDER — CARBIDOPA-LEVODOPA 25-100 MG PO TABS
2.0000 | ORAL_TABLET | Freq: Four times a day (QID) | ORAL | Status: DC
  Administered 2016-05-03 – 2016-05-06 (×10): 2 via ORAL
  Filled 2016-05-03 (×13): qty 2

## 2016-05-03 MED FILL — Medication: Qty: 1 | Status: AC

## 2016-05-03 NOTE — Code Documentation (Signed)
  Patient Name: Raymond Valdez   MRN: [id number]   Date of Birth/ Sex: [birth year] , male      Admission Date: 05/02/2016  Attending Provider: Albertine Patricia, MD  Primary Diagnosis: Acute respiratory failure [facility])   Indication: Pt was in his usual state of health until this PM, when he was noted to be coughing, bradycardic, and then asystolic. Code blue was subsequently called. At the time of arrival on scene, ACLS protocol was underway.   Technical Description:  - CPR performance duration:  5  minutes  - Was defibrillation or cardioversion used? No   - Was external pacer placed? Yes  - Was patient intubated pre/post CPR? Yes   Medications Administered: Y = Yes; Blank = No Amiodarone    Atropine    Calcium    Epinephrine  y  Lidocaine    Magnesium    Norepinephrine    Phenylephrine    Sodium bicarbonate    Vasopressin     Post CPR evaluation:  - Final Status - Was patient successfully resuscitated ? Yes - What is current rhythm? Atrial fibrillation - What is current hemodynamic status? stable  Miscellaneous Information:  - Labs sent, including: CBC, BMP, Lactate  - Primary team notified?  Yes  - Family Notified? Yes  - Additional notes/ transfer status: Transfer to [place], MD  05/03/2016, 12:09 PM

## 2016-05-03 NOTE — Progress Notes (Signed)
eLink Physician-Brief Progress Note Patient Name: Raymond Valdez DOB: 1928/12/25 MRN: [id number]   Date of Service  05/03/2016  HPI/Events of Note  Lactic Acid = 3.6. BP = 134/82. Coded earlier today.   eICU Interventions  Will order:  1. 0.9 NaCl 1 liter IV over 1 hour now. 2. Repeat Lactic Acid level at 9 PM.     Intervention Category Major Interventions: Acid-Base disturbance - evaluation and management  Cranston Koors [place] 05/03/2016, 5:20 PM

## 2016-05-03 NOTE — Consult Note (Signed)
PULMONARY / CRITICAL CARE MEDICINE   Name: Raymond Valdez MRN: [id number] DOB: [birth year]    ADMISSION DATE:  05/02/2016 CONSULTATION DATE:  6/7  REFERRING MD:  Dr. Waldron Labs  CHIEF COMPLAINT:  Aspiration Pneumonia and Cardiac Arrest  HISTORY OF PRESENT ILLNESS:   This is an 82 y.o. Male with a past medical history of Parkinson's disease, Hyperglycemia, Prostate Cancer s/p prostatectomy s/p radiation, Spinal stenosis, Bartonella infection, and Acute Respiratory Failure.  He presented to [facility] ER on 6/6 with c/o shortness of breath.  According to wife pt previously presented to [facility] ER with similar complaints on 6/1 in addition to cough and generalized weakness.  He was provided with nebulizers and discharged home at that time.  She noticed on 6/4 during meals he would cough and constantly clear his throat.  On 6/6 she could hear a lot of bubbling secretions in the back of his throat and he was struggling to breath.  EMS was called and upon arrival Bipap was applied.  According to wife the pt did not c/o abdominal pain, nausea, vomiting, dysuria, hematuria, urinary frequency or urgency.  He has had recent oral thrush with difficulty swallowing.  In the emergency department on 6/6 max temp 99.9 heart rate 122 and saturation level 97% on 2 L.  PCCM consulted 6/7 post cardiac arrest pt began coughing became bradycardic and then asystole ACLS protocol followed pt given 1 of epi and subsequently intubated due to family changing code status from Partial code to Full Code the code lasted a total of 5 minutes.  According to RT following intubation pt had a significant amount of purulent material from ETT during suctioning.  Pt currently in acute hypoxic respiratory failure secondary to aspiration pneumonia and worsening dysphagia secondary to oral thrush. Post CPR pt developed large left pneumothorax.    PAST MEDICAL HISTORY :  He  has a past medical history of Macular degeneration; History of  prostate cancer; Infection, bartonella; Spinal stenosis; Parkinson's disease ([place]) [id number]); Cancer ([place]); Hyperglycemia; and Acute respiratory failure ([place]).  PAST SURGICAL HISTORY: He  has past surgical history that includes Appendectomy; Tonsillectomy; Prostate surgery (1998); and Colonoscopy with propofol (N/A, 03/03/2013).  Allergies  Allergen Reactions  . Ciprofloxacin Rash  . Sulfa Antibiotics Itching and Rash  . Sulfa Drugs Cross Reactors Itching and Rash    Tribrisen, a sulfa potentiator, also causes reaction in patient, as does sulfa  . Benadryl [Diphenhydramine] Other (See Comments)    Per wife, TYLENOL PM exacerbated his Parkinson's Disease   . Tape Other (See Comments)    Most tape can tear the skin  . Tylenol [Acetaminophen] Other (See Comments)    Per wife, TYLENOL PM exacerbated his Parkinson's Disease     No current facility-administered medications on file prior to encounter.   Current Outpatient Prescriptions on File Prior to Encounter  Medication Sig  . albuterol (PROVENTIL HFA;VENTOLIN HFA) 108 (90 BASE) MCG/ACT inhaler Inhale 2 puffs into the lungs every 4 (four) hours as needed for wheezing or shortness of breath.  . Artificial Tear Solution (GENTEAL TEARS) 0.1-0.2-0.3 % SOLN Place 1-2 drops into both eyes daily as needed (for dry eyes).  . hydrocortisone cream 0.5 % Apply 1 application topically 2 (two) times daily as needed for itching.  . triamcinolone cream (KENALOG) 0.1 % Apply 1 application topically 2 (two) times daily.  . sildenafil (VIAGRA) 100 MG tablet Take 1 tablet (100 mg total) by mouth daily as needed for erectile dysfunction. (Patient not  taking: Reported on 01/05/2015)  . tadalafil (CIALIS) 5 MG tablet Take 1 tablet (5 mg total) by mouth daily as needed for erectile dysfunction. (Patient not taking: Reported on 01/05/2015)  . trimethoprim (TRIMPEX) 100 MG tablet Take 100 mg by mouth daily. Ongoing antibiotic    FAMILY HISTORY:  His has no family  status information on file.   SOCIAL HISTORY: He  reports that he quit smoking about 45 years ago. He has never used smokeless tobacco. He reports that he drinks alcohol. He reports that he does not use illicit drugs.  REVIEW OF SYSTEMS:   Unable to assess pt currently intubated   SUBJECTIVE:  Ill appearing male intubated on the vent does not follow commands however attempts to open eyes when stimulated  VITAL SIGNS: BP 120/62 mmHg  Pulse 81  Temp(Src) 97.5 F (36.4 C) (Axillary)  Resp 26  Ht 5\' 8"  (1.727 m)  Wt 173 lb 1 oz (78.5 kg)  BMI 26.32 kg/m2  SpO2 99%  HEMODYNAMICS:    VENTILATOR SETTINGS: Vent Mode:  [-] Stand-by FiO2 (%):  [50 %-60 %] 60 % PEEP:  [5 cmH20] 5 cmH20 Pressure Support:  [5 cmH20] 5 cmH20  INTAKE / OUTPUT: I/O last 3 completed shifts: In: 3480 [I.V.:3230; IV Piggyback:250] Out: -   PHYSICAL EXAMINATION: General:  Critically ill male Neuro:  Withdraws from painful stimulation, does not follow commands, Pupils 74mm left round sluggish, 2 mm right irregular sluggish HEENT:  ETT, supple, no JVD Cardiovascular:  s1s2 sinus tach, no murmur Lungs:  Coarse diminished throughout, even, nonlabored Abdomen:  +BS x4, soft, nondistended Musculoskeletal:  Poor tone bilateral upper and lower extremities Skin:  Intact no rashes, dusky toes and buttocks  LABS:  BMET  Recent Labs Lab 04/27/16 1132 05/02/16 1419 05/03/16 0405  NA 139 139 138  K 4.6 3.2* 4.0  CL 105 110 106  CO2 27 21* 25  BUN 23* 16 16  CREATININE 0.84 0.82 0.66  GLUCOSE 89 109* 124*    Electrolytes  Recent Labs Lab 04/27/16 1132 05/02/16 1419 05/02/16 1831 05/03/16 0405  CALCIUM 8.7* 7.0*  --  7.9*  MG  --   --  1.9  --     CBC  Recent Labs Lab 04/27/16 1132 05/02/16 1419 05/03/16 0405  WBC 10.5 19.0* 14.9*  HGB 15.5 14.1 13.1  HCT 47.4 43.7 42.6  PLT 158 141* 135*    Coag's No results for input(s): APTT, INR in the last 168 hours.  Sepsis  Markers  Recent Labs Lab 05/02/16 1458 05/02/16 1833 05/02/16 2242  LATICACIDVEN 2.15* 3.77* 2.2*    ABG  Recent Labs Lab 05/02/16 1444  PHART 7.392  PCO2ART 37.4  PO2ART 117.0*    Liver Enzymes  Recent Labs Lab 04/27/16 1132 05/02/16 1419  AST 17 12*  ALT 7* 5*  ALKPHOS 68 57  BILITOT 0.8 1.3*  ALBUMIN 3.6 2.5*    Cardiac Enzymes No results for input(s): TROPONINI, PROBNP in the last 168 hours.  Glucose  Recent Labs Lab 04/27/16 1050  GLUCAP 85    Imaging Dg Chest 2 View  05/02/2016  CLINICAL DATA:  Shortness of breath and cough for 1 day. EXAM: CHEST  2 VIEW COMPARISON:  April 27, 2016 FINDINGS: The heart size and mediastinal contours are stable. The heart size is mildly enlarged. The aorta is tortuous. There is no focal infiltrate, pulmonary edema, or pleural effusion. The visualized skeletal structures are stable. IMPRESSION: No active cardiopulmonary disease. Electronically Signed  By: Abelardo Diesel M.D.   On: 05/02/2016 15:41   Ct Head Wo Contrast  05/02/2016  CLINICAL DATA:  Altered mental status, poor historian, history Parkinson's EXAM: CT HEAD WITHOUT CONTRAST TECHNIQUE: Contiguous axial images were obtained from the base of the skull through the vertex without intravenous contrast. COMPARISON:  11/10/2015 FINDINGS: Generalized atrophy. Normal ventricular morphology. No midline shift or mass effect. Otherwise normal appearance of brain parenchyma. No intracranial hemorrhage, mass lesion, evidence acute infarction, or extra-axial fluid collection. Paranasal sinuses and mastoid air cells clear. Atherosclerotic calcifications at the carotid siphons. No acute osseous findings. IMPRESSION: Generalized atrophy. No acute intracranial abnormalities. Electronically Signed   By: Lavonia Dana M.D.   On: 05/02/2016 17:08   Dg Chest Port 1 View  05/03/2016  CLINICAL DATA:  Acute respiratory failure, spurs, dyspnea EXAM: PORTABLE CHEST 1 VIEW COMPARISON:  Portable chest  x-ray of May 02, 2016 FINDINGS: The lungs are adequately inflated. There is mildly increased density at both lung bases today. The cardiac silhouette is top-normal in size. The central pulmonary vascularity is prominent. There is no pulmonary edema or pleural effusion. The trachea is midline. The mediastinum is normal in width. The observed bony thorax is unremarkable. IMPRESSION: Developing bibasilar atelectasis or pneumonia. Electronically Signed   By: Deniro  Martinique M.D.   On: 05/03/2016 07:29     STUDIES:  CT of Head 6/6>> no acute changes generalized atrophy  CULTURES: Urine culture 6/6>> Blood culture 6/6>>  ANTIBIOTICS: Vancomycin 6/6>> Zosyn 6/6>>  SIGNIFICANT EVENTS: 6/1-Pt presented to [facility] ER on 6/6 with c/o shortness of breath and was admitted.  According to wife pt previously presented to [facility] ER with similar complaints on 6/1 in addition to cough and generalized weakness.  He was provided with nebulizers and discharged home.   6/7-PCCM consulted post cardiac arrest on 6/7 pt began coughing became bradycardic and then asystole ACLS protocol followed pt given 1 of epi and subsequently intubated due to family changing code status from Partial code to Full Code the code lasted a total of 5 minutes.  Pt currently in acute hypoxic respiratory failure secondary to aspiration pneumonia and worsening dysphagia secondary to oral thrush 6/7-Left lateral 5th rib space chest tube placed due development of large left pneumothorax post CPR   LINES/TUBES: PIV's x3 Left lateral 5th rib space chest tube 6/7>>  DISCUSSION: This is an 82 y.o. Male presented to [facility] ER on 6/6 with c/o shortness of breath.  According to wife pt previously presented to [facility] ER with similar complaints on 6/1 in addition to cough and generalized weakness.  He was provided with nebulizers and discharged home at that time. PCCM consulted post cardiac arrest on 6/7 pt began coughing became  bradycardic and then asystole ACLS protocol followed pt given 1 of epi code last 5 and subsequently intubated due to family changing code status from Partial code to Full Code the code lasted a total of 5 minutes.  Pt currently in acute hypoxic respiratory failure secondary to aspiration pneumonia and worsening dysphagia secondary to oral thrush.  ASSESSMENT / PLAN:  PULMONARY A: Acute hypoxic respiratory failure secondary to aspiration with possible pneumonia and worsening dysphagia secondary to oral thrush  Large left pneumothorax P:   Full vent support Vent Bundle Left lateral 5th rib space chest tube placed Trend ABG's CXR in am   CARDIOVASCULAR A:  Cardiac Arrest ([place]) Hypotension P:  Pt is not a candidate for hypothermia protocol due to advanced  age, advanced Parkinson's disease and pts. code status changed to DNR Telemetry monitoring Maintain map >65 1L fluid bolus for hypotension  RENAL A:   Hypokalemia-resolved P:   Trend BMP's Replace electrolytes as indicated  Monitor uop  GASTROINTESTINAL A:  Dysphagia  P:   PPI for PUP Keep NPO for now  HEMATOLOGIC A:   Hx: Prostate cancer s/p prostatectomy s/p radiation P:  Trend CBC's Lovenox for VTE prophylaxis Monitor for s/sx of bleeding Transfuse if HgB <7  INFECTIOUS A:   Aspiration Pneumonia UTI Oral Thrush Hx: Bartonella infection P:   Continue antibiotics as listed above Continue diflucan Trend WBC's and monitor fever curve Trend PCT's and Lactic Acid  ENDOCRINE A:   Hyperglycemia P:   CBG q4hrs Hyper/Hypoglycemic protocol as indicated   NEUROLOGIC A:   Hx: Advanced Parkinson's Disease, Spinal stenosis, and Macular degeneration P:   RASS goal: -1 Fentanly drip to maintain Rass goal Prn Fentanly and Versed to maintain RASS goal   FAMILY  - Updates: Family updated about plan of care and questions answered code status changed to DNR. Per family request plans to keep pt on ventilator  for a few days if status doesn't improve will discuss comfort measures.  - Inter-disciplinary family meet or Palliative Care meeting due by:  May 10, 2016  Marda Stalker, [place]  Attending Note:  82 year old male with parkinson who aspirated and developed respiratory with cardiac arrest subsequently.  The patient has been deteriorating with parkinson for sometime and was DNR but wife reversed it when he arrested.  Patient now intubated.  Had an extensive discussion with the wife, daughter and son.  After a long discussion, decision was made to make patient a full DNR again with no further escalation of care with focus more on comfort until son from [place] gets here then will discuss plan of care.  In the meantime, due to PTX will place a wayne catheter and will continue to monitor in the ICU.  Abx as ordered and will f/u on cultures.  Will re-evaluate in AM.  The patient is critically ill with multiple organ systems failure and requires high complexity decision making for assessment and support, frequent evaluation and titration of therapies, application of advanced monitoring technologies and extensive interpretation of multiple databases.   Critical Care Time devoted to patient care services described in this note is  12  Minutes. This time reflects time of care of this signee Dr Jennet Maduro. This critical care time does not reflect procedure time, or teaching time or supervisory time of PA/NP/Med student/Med Resident etc but could involve care discussion time.  Rush Farmer, M.D. [facility] Pulmonary/Critical Care Medicine. Pager: [phone]. After hours pager: [phone].

## 2016-05-03 NOTE — Progress Notes (Signed)
SLP Cancellation Note  Patient Details Name: Raymond Valdez MRN: [id number] DOB: 1929-09-28   Cancelled treatment:       Reason Eval/Treat Not Completed: Medical issues which prohibited therapy; pt coded, now intubated and being transferred to [facility].  Our services will sign off.    Juan Quam Laurice 05/03/2016, 12:17 PM

## 2016-05-03 NOTE — Progress Notes (Signed)
Called to patient room by family to alert to status of patient secretions. Family observed black secretions in patient mouth. Per family they used yankuer to suction pt mouth and noticed black secretions. Stated that the patient did desaturate so they suctioned him some more and patted him on the back and turned oxygen up to 4 L. Explained to patient family that they should call if this happens again. They voiced understanding. Pt continues to have rhonchi and black secretions noted in tubing/ on tongue. Left room to retrieve oral kit to clean patient mouth. Upon return suctioned patient mouth and discussed with family that I would call the doctor to let him know about secretions. After suction swab patient saturation continued to decrease. Called RRT to assess patient. Family and RN continued to try and get patient oxygen up. Upon arrival of another nurse to check on patient- patient found to desat to 40. Heart stopped. Code called. CPR started.  Milford Cage, RN

## 2016-05-03 NOTE — Progress Notes (Signed)
eLink Physician-Brief Progress Note Patient Name: LYDIA KRUMREY DOB: 12/24/1928 MRN: [id number]   Date of Service  05/03/2016  HPI/Events of Note  Notified of need for stress ulcer prophylaxis.  eICU Interventions  Will order Pepcid IV.      Intervention Category Intermediate Interventions: Best-practice therapies (e.g. DVT, beta blocker, etc.)  Adia Crammer [place] 05/03/2016, 7:16 PM

## 2016-05-03 NOTE — Progress Notes (Signed)
   05/03/16 1200  Clinical Encounter Type  Visited With Patient and family together;Health care provider  Visit Type Initial;Psychological support;Spiritual support;Social support;Code;Critical Care  Spiritual Encounters  Spiritual Needs Emotional   Chaplain was page to a Code blue .Marland KitchenMarland KitchenChaplain will take family to [place] [address]

## 2016-05-03 NOTE — Procedures (Signed)
Chest Tube Insertion Procedure Note  Indications:  Clinically significant Pneumothorax  Pre-operative Diagnosis: L Pneumothorax  Post-operative Diagnosis: same  Procedure Details  Informed consent was obtained for the procedure, including sedation.  Risks of lung perforation, hemorrhage, arrhythmia, and adverse drug reaction were discussed.   After sterile skin prep, using the Seldinger technique, a Wayne pigtail cathether was placed in the left lateral 5th rib space.  Findings: 20 ml of serosanguinous fluid obtained  Estimated Blood Loss:  Minimal         Specimens:  None              Complications:  None; patient tolerated the procedure well. CXR pending         Disposition: ICU - intubated and critically ill.         Condition: stable  Georgann Housekeeper, AGACNP-BC [place] Pulmonology/Critical Care Pager [phone] or [phone]  05/03/2016 2:52 PM  Rush Farmer, M.D. [facility] Pulmonary/Critical Care Medicine. Pager: [phone]. After hours pager: [phone].

## 2016-05-03 NOTE — Progress Notes (Addendum)
PROGRESS NOTE                                                                                                                                                                                                             Patient Demographics:    Raymond Valdez, is a 82 y.o. male, DOB - [birth year], [id number]  Admit date - 05/02/2016   Admitting Physician Waldemar Dickens, MD  Outpatient Primary MD for the patient is [name], MD  LOS - 1   Chief Complaint  Patient presents with  . Shortness of Breath       Brief Narrative  82 y.o. male with a Past Medical History of Parkinson's disease, prostate cancer who presents with sepsis ,  acute respiratory failure likely from aspiration w/ possible pneumonia, oral thrush, and worsening dysphagia, As well workup significant for UTI, basically on IV vancomycin and Zosyn, patient with significant dysphagia, patient with acute respiratory distress, where he lost pulse and CODE BLUE was called, where she required intubation(family changed CODE STATUS from limited to full code), with significant purulent material on suction after ET tube insertion.   Subjective:    Almond Lint today Status post CODE BLUE which lasted for 4 minutes.   Assessment  & Plan :    Principal Problem:   Acute respiratory failure (HCC) Active Problems:   Macular degeneration   Parkinson's disease (HCC)   SIRS (systemic inflammatory response syndrome) (HCC)   Hypokalemia   Elevated lactic acid level   Tachycardia   Dysphagia   Thrush, oral   Sepsis ([place])   Acute respiratory failure - Most likely related to aspiration from significant dysphagia from Parkinson disease, was nothing by mouth pending SLP consult, patient went into cardiopulmonary arrest, acquired CPR for 4 minutes, required intubation. - Further medical management per PCCM.  Aspiration pneumonia - Likely in the setting of significant dysphagia in the  setting of Parkinson disease - D/W Dr. Penelope Coop, at this point patient is unstable for workup, will reconsult if appropriate pending clinical course.  Oral thrush - Was being treated with Magic mouthwash  Parkinson disease - Used to follow with [facility], was supposed to establish care with [facility], initial appointment was for today.  UTI - Continue with broad-spectrum IV antibiotics pending urine culture     Code Status : currently  changed to full code  Family Communication  : Spoke with wife and daughter at bedside  Disposition Plan  : Transferred to ICU  Consults  :  PCCM  Procedures  : CPR/ intubation 6/7  DVT Prophylaxis  :  Lovenox   Lab Results  Component Value Date   PLT 135* 05/03/2016    Antibiotics  :    Anti-infectives    Start     Dose/Rate Route Frequency Ordered Stop   05/03/16 1000  fluconazole (DIFLUCAN) 40 MG/ML suspension 100 mg     100 mg Oral Daily 05/02/16 1627 05/11/16 0959   05/03/16 0400  vancomycin (VANCOCIN) IVPB 1000 mg/200 mL premix  Status:  Discontinued     1,000 mg 200 mL/hr over 60 Minutes Intravenous Every 12 hours 05/02/16 1552 05/02/16 1632   05/03/16 0400  vancomycin (VANCOCIN) IVPB 1000 mg/200 mL premix     1,000 mg 200 mL/hr over 60 Minutes Intravenous Every 12 hours 05/02/16 2251     05/02/16 2300  Ampicillin-Sulbactam (UNASYN) 3 g in sodium chloride 0.9 % 100 mL IVPB  Status:  Discontinued     3 g 100 mL/hr over 60 Minutes Intravenous Every 8 hours 05/02/16 1653 05/02/16 2243   05/02/16 2300  piperacillin-tazobactam (ZOSYN) IVPB 3.375 g  Status:  Discontinued     3.375 g 100 mL/hr over 30 Minutes Intravenous  Once 05/02/16 2243 05/02/16 2247   05/02/16 2300  vancomycin (VANCOCIN) IVPB 1000 mg/200 mL premix  Status:  Discontinued     1,000 mg 200 mL/hr over 60 Minutes Intravenous  Once 05/02/16 2243 05/02/16 2247   05/02/16 2300  piperacillin-tazobactam (ZOSYN) IVPB 3.375 g     3.375 g 12.5 mL/hr over 240  Minutes Intravenous Every 8 hours 05/02/16 2251     05/02/16 2200  piperacillin-tazobactam (ZOSYN) IVPB 3.375 g  Status:  Discontinued     3.375 g 12.5 mL/hr over 240 Minutes Intravenous Every 8 hours 05/02/16 1552 05/02/16 1632   05/02/16 1600  vancomycin (VANCOCIN) IVPB 1000 mg/200 mL premix  Status:  Discontinued     1,000 mg 200 mL/hr over 60 Minutes Intravenous  Once 05/02/16 1549 05/02/16 1722   05/02/16 1600  vancomycin (VANCOCIN) 500 mg in sodium chloride 0.9 % 100 mL IVPB  Status:  Discontinued     500 mg 100 mL/hr over 60 Minutes Intravenous  Once 05/02/16 1549 05/02/16 1632   05/02/16 1545  piperacillin-tazobactam (ZOSYN) IVPB 3.375 g     3.375 g 100 mL/hr over 30 Minutes Intravenous  Once 05/02/16 1533 05/02/16 1615   05/02/16 1545  vancomycin (VANCOCIN) IVPB 1000 mg/200 mL premix  Status:  Discontinued     1,000 mg 200 mL/hr over 60 Minutes Intravenous  Once 05/02/16 1533 05/02/16 1542   05/02/16 1545  vancomycin (VANCOCIN) 1,500 mg in sodium chloride 0.9 % 500 mL IVPB  Status:  Discontinued     1,500 mg 250 mL/hr over 120 Minutes Intravenous  Once 05/02/16 1542 05/02/16 1549        Objective:   Filed Vitals:   05/03/16 0009 05/03/16 0342 05/03/16 0403 05/03/16 0800  BP: 107/59 115/71  120/62  Pulse: 95 80  81  Temp: 97 F (36.1 C) 97.9 F (36.6 C)  97.5 F (36.4 C)  TempSrc: Axillary Axillary  Axillary  Resp: 23 24  26   Height:      Weight:   78.5 kg (173 lb 1 oz)   SpO2: 95% 100%  99%  Wt Readings from Last 3 Encounters:  05/03/16 78.5 kg (173 lb 1 oz)  12/19/13 85.276 kg (188 lb)  11/12/13 83.462 kg (184 lb)     Intake/Output Summary (Last 24 hours) at 05/03/16 1219 Last data filed at 05/03/16 0400  Gross per 24 hour  Intake   3480 ml  Output      0 ml  Net   3480 ml     Physical Exam  Responsive, intubated Supple Neck, diminshed , coarse lung sounds bilaterally No Gallops,Rubs or new Murmurs, +ve B.Sounds, Abd Soft, No tenderness, No  Cyanosis, Clubbing or edema,    Data Review:    CBC  Recent Labs Lab 04/27/16 1132 05/02/16 1419 05/03/16 0405  WBC 10.5 19.0* 14.9*  HGB 15.5 14.1 13.1  HCT 47.4 43.7 42.6  PLT 158 141* 135*  MCV 91.7 91.0 92.4  MCH 30.0 29.4 28.4  MCHC 32.7 32.3 30.8  RDW 15.1 15.0 15.4  LYMPHSABS 3.2 2.1  --   MONOABS 0.3 1.2*  --   EOSABS 0.2 0.0  --   BASOSABS 0.0 0.0  --     Chemistries   Recent Labs Lab 04/27/16 1132 05/02/16 1419 05/02/16 1831 05/03/16 0405  NA 139 139  --  138  K 4.6 3.2*  --  4.0  CL 105 110  --  106  CO2 27 21*  --  25  GLUCOSE 89 109*  --  124*  BUN 23* 16  --  16  CREATININE 0.84 0.82  --  0.66  CALCIUM 8.7* 7.0*  --  7.9*  MG  --   --  1.9  --   AST 17 12*  --   --   ALT 7* 5*  --   --   ALKPHOS 68 57  --   --   BILITOT 0.8 1.3*  --   --    ------------------------------------------------------------------------------------------------------------------ No results for input(s): CHOL, HDL, LDLCALC, TRIG, CHOLHDL, LDLDIRECT in the last 72 hours.  Lab Results  Component Value Date   HGBA1C 5.3 % 04/25/2013   ------------------------------------------------------------------------------------------------------------------  Recent Labs  05/02/16 1831  TSH 1.340   ------------------------------------------------------------------------------------------------------------------ No results for input(s): VITAMINB12, FOLATE, FERRITIN, TIBC, IRON, RETICCTPCT in the last 72 hours.  Coagulation profile No results for input(s): INR, PROTIME in the last 168 hours.  No results for input(s): DDIMER in the last 72 hours.  Cardiac Enzymes No results for input(s): CKMB, TROPONINI, MYOGLOBIN in the last 168 hours.  Invalid input(s): CK ------------------------------------------------------------------------------------------------------------------    Component Value Date/Time   BNP 284.8* 05/02/2016 1419    Inpatient Medications  Scheduled  Meds: . antiseptic oral rinse  7 mL Mouth Rinse q12n4p  . Carbidopa-Levodopa ER  2 tablet Oral QID  . chlorhexidine  15 mL Mouth Rinse BID  . enoxaparin (LOVENOX) injection  40 mg Subcutaneous Q24H  . fluconazole  100 mg Oral Daily  . piperacillin-tazobactam (ZOSYN)  IV  3.375 g Intravenous Q8H  . triamcinolone cream  1 application Topical BID  . vancomycin  1,000 mg Intravenous Q12H   Continuous Infusions:  PRN Meds:.acetaminophen **OR** acetaminophen, bisacodyl, ondansetron **OR** ondansetron (ZOFRAN) IV  Micro Results Recent Results (from the past 240 hour(s))  Blood Culture (routine x 2)     Status: None   Collection Time: 04/27/16 11:35 AM  Result Value Ref Range Status   Specimen Description BLOOD RIGHT WRIST  Final   Special Requests IN PEDIATRIC BOTTLE 2 CC  Final   Culture  Final    NO GROWTH 5 DAYS Performed at [facility]    Report Status 05/02/2016 FINAL  Final  Blood Culture (routine x 2)     Status: None   Collection Time: 04/27/16 11:35 AM  Result Value Ref Range Status   Specimen Description BLOOD LEFT WRIST  Final   Special Requests   Final    BOTTLES DRAWN AEROBIC AND ANAEROBIC 5 CC BLUE, 2 CC RED   Culture   Final    NO GROWTH 5 DAYS Performed at [facility]    Report Status 05/02/2016 FINAL  Final  Urine culture     Status: Abnormal   Collection Time: 04/27/16 12:43 PM  Result Value Ref Range Status   Specimen Description URINE, CLEAN CATCH  Final   Special Requests NONE  Final   Culture MULTIPLE SPECIES PRESENT, SUGGEST RECOLLECTION (A)  Final   Report Status 04/28/2016 FINAL  Final  MRSA PCR Screening     Status: None   Collection Time: 05/02/16 10:52 PM  Result Value Ref Range Status   MRSA by PCR NEGATIVE NEGATIVE Final    Comment:        The GeneXpert MRSA Assay (FDA approved for NASAL specimens only), is one component of a comprehensive MRSA colonization surveillance program. It is not intended to diagnose MRSA infection  nor to guide or monitor treatment for MRSA infections.     Radiology Reports Dg Chest 2 View  05/02/2016  CLINICAL DATA:  Shortness of breath and cough for 1 day. EXAM: CHEST  2 VIEW COMPARISON:  April 27, 2016 FINDINGS: The heart size and mediastinal contours are stable. The heart size is mildly enlarged. The aorta is tortuous. There is no focal infiltrate, pulmonary edema, or pleural effusion. The visualized skeletal structures are stable. IMPRESSION: No active cardiopulmonary disease. Electronically Signed   By: Abelardo Diesel M.D.   On: 05/02/2016 15:41   Dg Chest 2 View  04/27/2016  CLINICAL DATA:  Fever today.  Difficulty swallowing for 1 week. EXAM: CHEST  2 VIEW COMPARISON:  01/05/2015 FINDINGS: Cardiomediastinal silhouette is normal. Mediastinal contours appear intact. Calcified atherosclerotic disease and tortuosity of the aorta are noted. There is no evidence of focal airspace consolidation, pleural effusion or pneumothorax. Osseous structures are without acute abnormality. Soft tissues are grossly normal. IMPRESSION: No active cardiopulmonary disease. Electronically Signed   By: Fidela Salisbury M.D.   On: 04/27/2016 11:48   Ct Head Wo Contrast  05/02/2016  CLINICAL DATA:  Altered mental status, poor historian, history Parkinson's EXAM: CT HEAD WITHOUT CONTRAST TECHNIQUE: Contiguous axial images were obtained from the base of the skull through the vertex without intravenous contrast. COMPARISON:  11/10/2015 FINDINGS: Generalized atrophy. Normal ventricular morphology. No midline shift or mass effect. Otherwise normal appearance of brain parenchyma. No intracranial hemorrhage, mass lesion, evidence acute infarction, or extra-axial fluid collection. Paranasal sinuses and mastoid air cells clear. Atherosclerotic calcifications at the carotid siphons. No acute osseous findings. IMPRESSION: Generalized atrophy. No acute intracranial abnormalities. Electronically Signed   By: Lavonia Dana M.D.   On:  05/02/2016 17:08   Dg Chest Port 1 View  05/03/2016  CLINICAL DATA:  Acute respiratory failure, spurs, dyspnea EXAM: PORTABLE CHEST 1 VIEW COMPARISON:  Portable chest x-ray of May 02, 2016 FINDINGS: The lungs are adequately inflated. There is mildly increased density at both lung bases today. The cardiac silhouette is top-normal in size. The central pulmonary vascularity is prominent. There is no pulmonary edema or pleural  effusion. The trachea is midline. The mediastinum is normal in width. The observed bony thorax is unremarkable. IMPRESSION: Developing bibasilar atelectasis or pneumonia. Electronically Signed   By: Carles  Martinique M.D.   On: 05/03/2016 07:29   Time spent 25 mintues   Cary Lothrop M.D on 05/03/2016 at 12:19 PM  Between 7am to 7pm - Pager - [phone]  After 7pm go to [email or web address] - password [facility]  [facility] -  Office  [phone]

## 2016-05-04 ENCOUNTER — Inpatient Hospital Stay (HOSPITAL_COMMUNITY): Payer: Medicare Other

## 2016-05-04 DIAGNOSIS — G20 Parkinson's disease: Secondary | ICD-10-CM

## 2016-05-04 LAB — BLOOD GAS, ARTERIAL
ACID-BASE EXCESS: 0.1 mmol/L (ref 0.0–2.0)
Bicarbonate: 24.2 mEq/L — ABNORMAL HIGH (ref 20.0–24.0)
DRAWN BY: 39898
FIO2: 0.4
LHR: 16 {breaths}/min
MECHVT: 550 mL
O2 Saturation: 97.5 %
PATIENT TEMPERATURE: 98.6
PCO2 ART: 39 mmHg (ref 35.0–45.0)
PEEP/CPAP: 5 cmH2O
PH ART: 7.409 (ref 7.350–7.450)
PO2 ART: 93.1 mmHg (ref 80.0–100.0)
TCO2: 25.4 mmol/L (ref 0–100)

## 2016-05-04 LAB — GLUCOSE, CAPILLARY
Glucose-Capillary: 118 mg/dL — ABNORMAL HIGH (ref 65–99)
Glucose-Capillary: 139 mg/dL — ABNORMAL HIGH (ref 65–99)

## 2016-05-04 LAB — CBC WITH DIFFERENTIAL/PLATELET
Basophils Absolute: 0 10*3/uL (ref 0.0–0.1)
Basophils Relative: 0 %
EOS PCT: 0 %
Eosinophils Absolute: 0 10*3/uL (ref 0.0–0.7)
HEMATOCRIT: 38.4 % — AB (ref 39.0–52.0)
Hemoglobin: 12 g/dL — ABNORMAL LOW (ref 13.0–17.0)
LYMPHS ABS: 2.6 10*3/uL (ref 0.7–4.0)
LYMPHS PCT: 14 %
MCH: 28.6 pg (ref 26.0–34.0)
MCHC: 31.3 g/dL (ref 30.0–36.0)
MCV: 91.6 fL (ref 78.0–100.0)
MONO ABS: 1.2 10*3/uL — AB (ref 0.1–1.0)
Monocytes Relative: 7 %
Neutro Abs: 14.4 10*3/uL — ABNORMAL HIGH (ref 1.7–7.7)
Neutrophils Relative %: 79 %
PLATELETS: 146 10*3/uL — AB (ref 150–400)
RBC: 4.19 MIL/uL — ABNORMAL LOW (ref 4.22–5.81)
RDW: 15.2 % (ref 11.5–15.5)
WBC: 18.2 10*3/uL — ABNORMAL HIGH (ref 4.0–10.5)

## 2016-05-04 LAB — BASIC METABOLIC PANEL
Anion gap: 5 (ref 5–15)
BUN: 19 mg/dL (ref 6–20)
CALCIUM: 8 mg/dL — AB (ref 8.9–10.3)
CO2: 25 mmol/L (ref 22–32)
Chloride: 111 mmol/L (ref 101–111)
Creatinine, Ser: 0.87 mg/dL (ref 0.61–1.24)
GFR calc Af Amer: >60 mL/min (ref >60)
GLUCOSE: 100 mg/dL — AB (ref 65–99)
POTASSIUM: 3.9 mmol/L (ref 3.5–5.1)
Sodium: 141 mmol/L (ref 135–145)

## 2016-05-04 LAB — PHOSPHORUS: PHOSPHORUS: 2 mg/dL — AB (ref 2.5–4.6)

## 2016-05-04 LAB — MAGNESIUM: MAGNESIUM: 2 mg/dL (ref 1.7–2.4)

## 2016-05-04 MED ORDER — MAGIC MOUTHWASH
2.0000 mL | Freq: Three times a day (TID) | ORAL | Status: DC
  Administered 2016-05-04 – 2016-05-06 (×7): 2 mL via ORAL
  Filled 2016-05-04 (×8): qty 5

## 2016-05-04 MED ORDER — PRO-STAT SUGAR FREE PO LIQD
30.0000 mL | Freq: Two times a day (BID) | ORAL | Status: DC
  Administered 2016-05-04 – 2016-05-06 (×5): 30 mL
  Filled 2016-05-04 (×5): qty 30

## 2016-05-04 MED ORDER — VITAL AF 1.2 CAL PO LIQD
1000.0000 mL | ORAL | Status: DC
  Administered 2016-05-04 – 2016-05-06 (×4): 1000 mL

## 2016-05-04 MED ORDER — SODIUM PHOSPHATES 45 MMOLE/15ML IV SOLN
20.0000 mmol | Freq: Once | INTRAVENOUS | Status: AC
  Administered 2016-05-04: 20 mmol via INTRAVENOUS
  Filled 2016-05-04: qty 6.67

## 2016-05-04 NOTE — Progress Notes (Signed)
[facility] Progress Note Patient Name: Raymond Valdez DOB: 1929/08/08 MRN: [id number]   Date of Service  05/04/2016  HPI/Events of Note  PO4--- = 2.0 and Creatinine = 0.87.  eICU Interventions  Will replete PO4---.     Intervention Category Intermediate Interventions: Electrolyte abnormality - evaluation and management  Sommer,Steven Eugene 05/04/2016, 7:04 PM

## 2016-05-04 NOTE — Progress Notes (Signed)
Palliative Medicine Team consult was received.   I spoke briefly with patient's wife and daughter as well as with Dr. Nelda Marseille.  Plan for family meeting tomorrow AM in conjunction with Dr. Nelda Marseille once his son arrives from out of town.   If there are urgent needs or questions please call [phone]. Thank you for consulting out team to assist with this patients care.  Micheline Rough, MD [place] Team [phone]

## 2016-05-04 NOTE — Progress Notes (Signed)
Initial Nutrition Assessment  DOCUMENTATION CODES:   Not applicable  INTERVENTION:   Initiate Vital AF 1.2 @ 35 ml/hr via OG tube and increase by 10 ml every 4 hours to goal rate of 65 ml/hr.   Tube feeding regimen provides 1872 kcal (105% of needs), 117 grams of protein, and 1265 ml of H2O.   Monitor Phosphorus and Magnesium due to hx of dysphagia.   NUTRITION DIAGNOSIS:   Increased nutrient needs related to wound healing as evidenced by estimated needs.  GOAL:   Patient will meet greater than or equal to 90% of their needs  MONITOR:   Skin, I & O's, Vent status, TF tolerance  REASON FOR ASSESSMENT:   Consult Enteral/tube feeding initiation and management  ASSESSMENT:   Admitted 6/6 with SOB. Pt with hx of Parkinsons's dz, prostat cancer s/p prostatectomy s/p XRT, hyperglycemia. On 6/7 pt coughing and had cardiac arrest. Pt in acute hypoxic respiratory failure secondary to aspiration PNA and worsening dysphgia secondary to oral thrush.    6/7 intubated  Patient is currently intubated on ventilator support MV: 11.5 L/min Temp (24hrs), Avg:98.9 F (37.2 C), Min:98.7 F (37.1 C), Max:99.2 F (37.3 C)  OG tube Medications reviewed and include: magic maouthwash Labs reviewed  Unable to complete Nutrition-Focused physical exam at this time.  Reports dysphagia for weeks.   Diet Order:  Diet NPO time specified Except for: Sips with Meds  Skin:  Wound (see comment) (stage II sacrum)  Last BM:  6/6  Height:   Ht Readings from Last 1 Encounters:  05/02/16 5\' 8"  (1.727 m)    Weight:   Wt Readings from Last 1 Encounters:  05/04/16 178 lb 9.2 oz (81 kg)    Ideal Body Weight:  70 kg  BMI:  Body mass index is 27.16 kg/(m^2).  Estimated Nutritional Needs:   Kcal:  [id number]  Protein:  110-125 grams  Fluid:  >1.7 L/day  EDUCATION NEEDS:   No education needs identified at this time  [place], [place], [place] Pager [phone] After Hours  Pager

## 2016-05-04 NOTE — Progress Notes (Signed)
PULMONARY / CRITICAL CARE MEDICINE   Name: Raymond Valdez MRN: [id number] DOB: 01-20-1929    ADMISSION DATE:  05/02/2016 CONSULTATION DATE:  6/7  REFERRING MD:  Dr. Waldron Labs  CHIEF COMPLAINT:  Aspiration Pneumonia and Cardiac Arrest  HISTORY OF PRESENT ILLNESS:   This is an 82 y.o. Male with a past medical history of Parkinson's disease, Hyperglycemia, Prostate Cancer s/p prostatectomy s/p radiation, Spinal stenosis, Bartonella infection, and Acute Respiratory Failure.  He presented to [facility] ER on 6/6 with c/o shortness of breath.  According to wife pt previously presented to [facility] ER with similar complaints on 6/1 in addition to cough and generalized weakness.  He was provided with nebulizers and discharged home at that time.  She noticed on 6/4 during meals he would cough and constantly clear his throat.  On 6/6 she could hear a lot of bubbling secretions in the back of his throat and he was struggling to breath.  EMS was called and upon arrival Bipap was applied.  According to wife the pt did not c/o abdominal pain, nausea, vomiting, dysuria, hematuria, urinary frequency or urgency.  He has had recent oral thrush with difficulty swallowing.  In the emergency department on 6/6 max temp 99.9 heart rate 122 and saturation level 97% on 2 L.  PCCM consulted 6/7 post cardiac arrest pt began coughing became bradycardic and then asystole ACLS protocol followed pt given 1 of epi and subsequently intubated due to family changing code status from Partial code to Full Code the code lasted a total of 5 minutes.  According to RT following intubation pt had a significant amount of purulent material from ETT during suctioning.  Pt currently in acute hypoxic respiratory failure secondary to aspiration pneumonia and worsening dysphagia secondary to oral thrush. Post CPR pt developed large left pneumothorax.    SUBJECTIVE:  More awake this morning, following commands today. Doing well on SBT for about 2  hours now.  VITAL SIGNS: BP 111/66 mmHg  Pulse 96  Temp(Src) 99 F (37.2 C) (Oral)  Resp 27  Ht 5\' 8"  (1.727 m)  Wt 81 kg (178 lb 9.2 oz)  BMI 27.16 kg/m2  SpO2 98%  HEMODYNAMICS:    VENTILATOR SETTINGS: Vent Mode:  [-] PSV;CPAP FiO2 (%):  [40 %-100 %] 40 % Set Rate:  [16 bmp] 16 bmp Vt Set:  [550 mL] 550 mL PEEP:  [5 cmH20] 5 cmH20 Pressure Support:  [5 cmH20] 5 cmH20 Plateau Pressure:  [11 cmH20-23 cmH20] 17 cmH20  INTAKE / OUTPUT: I/O last 3 completed shifts: In: 2087.6 [I.V.:1787.6; IV Piggyback:300] Out: 50 [Chest Tube:69]  PHYSICAL EXAMINATION: General:  Elderly male on vent Neuro:  Follows commands. Nods yes/no appropriately.  HEENT:  ETT, supple, no JVD Cardiovascular:  s1s2 sinus tach, no murmur Lungs:  Coarse bilateral. Tolerating SBT well Abdomen:  +BS x4, soft, nondistended Musculoskeletal:  Poor tone bilateral upper and lower extremities Skin: Grossly intact  LABS:  BMET  Recent Labs Lab 05/02/16 1419 05/03/16 0405 05/04/16 0300  NA 139 138 141  K 3.2* 4.0 3.9  CL 110 106 111  CO2 21* 25 25  BUN 16 16 19   CREATININE 0.82 0.66 0.87  GLUCOSE 109* 124* 100*    Electrolytes  Recent Labs Lab 05/02/16 1419 05/02/16 1831 05/03/16 0405 05/04/16 0300  CALCIUM 7.0*  --  7.9* 8.0*  MG  --  1.9  --   --     CBC  Recent Labs Lab 05/02/16 1419 05/03/16 0405  05/04/16 0300  WBC 19.0* 14.9* 18.2*  HGB 14.1 13.1 12.0*  HCT 43.7 42.6 38.4*  PLT 141* 135* 146*    Coag's No results for input(s): APTT, INR in the last 168 hours.  Sepsis Markers  Recent Labs Lab 05/03/16 1212 05/03/16 1558 05/03/16 2150  LATICACIDVEN 3.3* 3.6* 1.6    ABG  Recent Labs Lab 05/02/16 1444 05/03/16 1452 05/04/16 0347  PHART 7.392 7.269* 7.409  PCO2ART 37.4 50.1* 39.0  PO2ART 117.0* 257.0* 93.1    Liver Enzymes  Recent Labs Lab 05/02/16 1419  AST 12*  ALT 5*  ALKPHOS 57  BILITOT 1.3*  ALBUMIN 2.5*    Cardiac Enzymes No results  for input(s): TROPONINI, PROBNP in the last 168 hours.  Glucose  Recent Labs Lab 05/03/16 1549  GLUCAP 131*    Imaging Dg Chest Port 1 View  05/04/2016  CLINICAL DATA:  Acute respiratory failure, sirs, Parkinson's disease. EXAM: PORTABLE CHEST 1 VIEW COMPARISON:  Portable chest x-ray of May 03, 2016 FINDINGS: The lungs are adequately inflated. There is subtle hyperlucency in the left apex consistent with a probable 5% pneumothorax. A faint pleural line is visible medially. The pigtail catheter in the right lower hemi thorax is more inferiorly positioned today. There is persistent bibasilar interstitial density which has improved somewhat. There is no large pleural effusion. The heart is top-normal in size. The pulmonary vascularity is more distinct. The endotracheal tube tip lies 4.1 cm above the carina. The esophagogastric tubes proximal port lies in the region of the gastric cardia. IMPRESSION: Probable 5% left apical pneumothorax. The left-sided chest tube remains but is positioned slightly lower today. Improving bibasilar atelectasis. Decreased pulmonary interstitial edema overall. Electronically Signed   By: Berthold  Martinique M.D.   On: 05/04/2016 07:50   Dg Chest Port 1 View  05/03/2016  CLINICAL DATA:  Pneumothorax today after CPR. Status post chest tube placement. EXAM: PORTABLE CHEST 1 VIEW COMPARISON:  Single-view of the chest earlier today. FINDINGS: A new pigtail catheter is in place in the left chest. Left pneumothorax seen on the prior study has resolved. Small amount of subcutaneous emphysema in left chest wall is noted. Patchy airspace disease throughout the right chest is improved with atelectatic change seen in the right lung base. Heart size is upper normal. NG tube and ET tube are again noted. IMPRESSION: Resolved left pneumothorax with a chest tube in place. Atelectasis right lung base. Electronically Signed   By: Inge Rise M.D.   On: 05/03/2016 15:13   Dg Chest Port 1  View  05/03/2016  CLINICAL DATA:  Status post OG tube and central line placement. Status post CPR today. Initial encounter. EXAM: PORTABLE CHEST 1 VIEW COMPARISON:  Single-view of the chest earlier today. FINDINGS: The patient has a new large left pneumothorax with shift of the mediastinal contents to the right. Patchy airspace disease is now seen throughout the right lung. New endotracheal tube is in place with the tip at the level of the clavicular heads in good position. OG tube tip is just within the stomach. Defibrillator pads are in place. No new central line is visualized. IMPRESSION: Large left pneumothorax appears to be under tension. Endotracheal tube in good position. OG tube tip is just within the stomach. Recommend advancement of 3-4 cm. Patchy airspace disease throughout the right chest could be due to pulmonary edema, aspiration or pneumonia. Critical Value/emergent results were called by telephone at the time of interpretation on 05/03/2016 at 2:08 pm to Dr. Michaelene Song  Dioselina Brumbaugh , who verbally acknowledged these results. Electronically Signed   By: Inge Rise M.D.   On: 05/03/2016 14:10     STUDIES:  CT of Head 6/6>> no acute changes generalized atrophy  CULTURES: Urine culture 6/6>> Blood culture 6/6>>  ANTIBIOTICS: Vancomycin 6/6>> Zosyn 6/6>>  SIGNIFICANT EVENTS: 6/1-Pt presented to [facility] ER on 6/6 with c/o shortness of breath and was admitted.  According to wife pt previously presented to [facility] ER with similar complaints on 6/1 in addition to cough and generalized weakness.  He was provided with nebulizers and discharged home.   6/7-PCCM consulted post cardiac arrest on 6/7 pt began coughing became bradycardic and then asystole ACLS protocol followed pt given 1 of epi and subsequently intubated due to family changing code status from Partial code to Full Code the code lasted a total of 5 minutes.  Pt currently in acute hypoxic respiratory failure secondary to aspiration  pneumonia and worsening dysphagia secondary to oral thrush 6/7-Left lateral 5th rib space chest tube placed due development of large left pneumothorax post CPR   LINES/TUBES: PIV's x3 Left lateral 5th rib space chest tube 6/7>>  DISCUSSION: This is an 82 y.o. Male presented to [facility] ER on 6/6 with c/o shortness of breath.  According to wife pt previously presented to [facility] ER with similar complaints on 6/1 in addition to cough and generalized weakness.  He was provided with nebulizers and discharged home at that time. PCCM consulted post cardiac arrest on 6/7 pt began coughing became bradycardic and then asystole ACLS protocol followed pt given 1 of epi code last 5 and subsequently intubated due to family changing code status from Partial code to Full Code the code lasted a total of 5 minutes.  Pt currently in acute hypoxic respiratory failure secondary to aspiration pneumonia and worsening dysphagia secondary to oral thrush.  ASSESSMENT / PLAN:  PULMONARY A: Acute hypoxic respiratory failure secondary to aspiration with possible pneumonia and worsening dysphagia dt parkinsons.  Large left pneumothorax (remains 5% on 6/8) Probable aspiration P:   Full vent support, tolerating PS, hopeful for tomorrow for extubation.  Vent Bundle Left lateral 5th rib space chest tube placed, maintain to 20cm suction. No air leak identified Trend ABG's CXR in am   CARDIOVASCULAR A:  Cardiac Arrest (asystole) Hypotension > resolved with volume (required short term phenylephrine for procedural sedation) LA cleared P:  Pt is not a candidate for hypothermia protocol due to advanced age, advanced Parkinson's disease and pts. Changed to DNR should he arrest Telemetry monitoring Approximate MAP goal of 60. Volume to achieve goal.  No pressors/CVL.  RENAL A:   Hypokalemia-resolved P:   Trend BMP's Replace electrolytes as indicated  Keep even  GASTROINTESTINAL A:   Dysphagia   P:    PPI for SUP Start TF  HEMATOLOGIC A:   Hx: Prostate cancer s/p prostatectomy s/p radiation P:  Trend CBC's Lovenox for VTE prophylaxis Monitor for s/sx of bleeding Transfuse if HgB <7  INFECTIOUS A:   Aspiration Pneumonia UTI Oral Thrush Hx: Bartonella infection P:   Continue antibiotics as listed above Continue diflucan Trend WBC's and monitor fever curve  ENDOCRINE A:   Hyperglycemia P:   CBG q4hrs Hyper/Hypoglycemic protocol as indicated   NEUROLOGIC A:   Acute metabolic/anoxic encephalopathy 2/2 cardiac arrest Hx: Advanced Parkinson's Disease, Spinal stenosis, and Macular degeneration P:   RASS goal: -1 Fentanly drip, PRN Versed to maintain RASS goal   FAMILY  - Updates:  Family updated about plan of care and questions answered code status changed to DNR. Per family request plans to keep pt on ventilator for a few days if status doesn't improve will discuss comfort measures. Still debating whether potential extubation would be terminal. Will plan for extubation tomorrow after family arrives if he progresses as expected. Have asked palliative to assist.   - Inter-disciplinary family meet or Palliative Care meeting due by:  May 10, 2016  Georgann Housekeeper, AGACNP-BC [facility] Pulmonology/Critical Care Pager [phone] or [phone]  05/04/2016 11:53 AM  Attending Note:  82 year old male with parkinson who aspirated and developed respiratory with cardiac arrest subsequently. The patient has been deteriorating with parkinson for sometime and was DNR but wife reversed it when he arrested. Patient now intubated. Had an extensive discussion with the wife, daughter and son. After a long discussion, decision was made to make patient a full DNR again with no further escalation of care with focus more on comfort until son from [place] gets here then will discuss plan of care. In the meantime, due to PTX will place a wayne catheter and will continue to monitor in the  ICU. Continue abx.  I discussed case with patient's family at length, will call palliative care and arranged for a family meeting in AM after the last son arrives from [place].  The patient is critically ill with multiple organ systems failure and requires high complexity decision making for assessment and support, frequent evaluation and titration of therapies, application of advanced monitoring technologies and extensive interpretation of multiple databases.   Critical Care Time devoted to patient care services described in this note is 13 Minutes. This time reflects time of care of this signee Dr Jennet Maduro. This critical care time does not reflect procedure time, or teaching time or supervisory time of PA/NP/Med student/Med Resident etc but could involve care discussion time.  Rush Farmer, M.D. [facility] Pulmonary/Critical Care Medicine. Pager: [phone]. After hours pager: [phone].

## 2016-05-05 ENCOUNTER — Inpatient Hospital Stay (HOSPITAL_COMMUNITY): Payer: Medicare Other

## 2016-05-05 DIAGNOSIS — Z515 Encounter for palliative care: Secondary | ICD-10-CM

## 2016-05-05 DIAGNOSIS — Z7189 Other specified counseling: Secondary | ICD-10-CM

## 2016-05-05 LAB — GLUCOSE, CAPILLARY
GLUCOSE-CAPILLARY: 164 mg/dL — AB (ref 65–99)
Glucose-Capillary: 139 mg/dL — ABNORMAL HIGH (ref 65–99)
Glucose-Capillary: 138 mg/dL — ABNORMAL HIGH (ref 65–99)
Glucose-Capillary: 144 mg/dL — ABNORMAL HIGH (ref 65–99)
Glucose-Capillary: 141 mg/dL — ABNORMAL HIGH (ref 65–99)

## 2016-05-05 LAB — BASIC METABOLIC PANEL
Anion gap: 9 (ref 5–15)
BUN: 26 mg/dL — AB (ref 6–20)
CO2: 26 mmol/L (ref 22–32)
Calcium: 7.7 mg/dL — ABNORMAL LOW (ref 8.9–10.3)
Chloride: 105 mmol/L (ref 101–111)
Creatinine, Ser: 0.95 mg/dL (ref 0.61–1.24)
GFR calc Af Amer: >60 mL/min (ref >60)
GLUCOSE: 123 mg/dL — AB (ref 65–99)
POTASSIUM: 3.1 mmol/L — AB (ref 3.5–5.1)
Sodium: 140 mmol/L (ref 135–145)

## 2016-05-05 LAB — CBC
HEMATOCRIT: 36.2 % — AB (ref 39.0–52.0)
Hemoglobin: 11.4 g/dL — ABNORMAL LOW (ref 13.0–17.0)
MCH: 28.5 pg (ref 26.0–34.0)
MCHC: 31.5 g/dL (ref 30.0–36.0)
MCV: 90.5 fL (ref 78.0–100.0)
PLATELETS: 140 10*3/uL — AB (ref 150–400)
RBC: 4 MIL/uL — AB (ref 4.22–5.81)
RDW: 15.3 % (ref 11.5–15.5)
WBC: 15.6 10*3/uL — ABNORMAL HIGH (ref 4.0–10.5)

## 2016-05-05 LAB — MAGNESIUM
Magnesium: 1.8 mg/dL (ref 1.7–2.4)
Magnesium: 2.4 mg/dL (ref 1.7–2.4)

## 2016-05-05 LAB — PHOSPHORUS
PHOSPHORUS: 2.8 mg/dL (ref 2.5–4.6)
Phosphorus: 2.5 mg/dL (ref 2.5–4.6)

## 2016-05-05 MED ORDER — INSULIN ASPART 100 UNIT/ML ~~LOC~~ SOLN
2.0000 [IU] | SUBCUTANEOUS | Status: DC
  Administered 2016-05-05 – 2016-05-06 (×7): 2 [IU] via SUBCUTANEOUS

## 2016-05-05 MED ORDER — MAGNESIUM SULFATE 2 GM/50ML IV SOLN
2.0000 g | Freq: Once | INTRAVENOUS | Status: AC
  Administered 2016-05-05: 2 g via INTRAVENOUS
  Filled 2016-05-05: qty 50

## 2016-05-05 MED ORDER — FUROSEMIDE 10 MG/ML IJ SOLN
40.0000 mg | Freq: Once | INTRAMUSCULAR | Status: AC
  Administered 2016-05-05: 40 mg via INTRAVENOUS
  Filled 2016-05-05: qty 4

## 2016-05-05 MED ORDER — FUROSEMIDE 10 MG/ML IJ SOLN
40.0000 mg | Freq: Four times a day (QID) | INTRAMUSCULAR | Status: AC
  Administered 2016-05-05: 40 mg via INTRAVENOUS
  Filled 2016-05-05: qty 4

## 2016-05-05 MED ORDER — POTASSIUM CHLORIDE 20 MEQ/15ML (10%) PO SOLN
40.0000 meq | Freq: Three times a day (TID) | ORAL | Status: AC
  Administered 2016-05-05 (×2): 40 meq
  Filled 2016-05-05 (×2): qty 30

## 2016-05-05 MED ORDER — VANCOMYCIN HCL IN DEXTROSE 750-5 MG/150ML-% IV SOLN
750.0000 mg | Freq: Two times a day (BID) | INTRAVENOUS | Status: DC
  Administered 2016-05-05 – 2016-05-06 (×2): 750 mg via INTRAVENOUS
  Filled 2016-05-05 (×4): qty 150

## 2016-05-05 MED ORDER — ALBUMIN HUMAN 25 % IV SOLN
12.5000 g | Freq: Once | INTRAVENOUS | Status: AC
Start: 2016-05-05 — End: 2016-05-05
  Administered 2016-05-05: 12.5 g via INTRAVENOUS
  Filled 2016-05-05: qty 50

## 2016-05-05 NOTE — Progress Notes (Signed)
eLink Physician-Brief Progress Note Patient Name: Raymond Valdez DOB: 03/22/1929 MRN: [id number]   Date of Service  05/05/2016  HPI/Events of Note  Hypotension - BP = 88/47. Has diuresed 2+ liters since Lasix at 11 AM. Albumin = 2.5  eICU Interventions  Will order: 1. Hold Lasix for SBP < 100. 2. Albumin 25% 12.5 gm IV now.     Intervention Category Major Interventions: Hypotension - evaluation and management  Ariana Cavenaugh Eugene 05/05/2016, 3:21 PM

## 2016-05-05 NOTE — Progress Notes (Signed)
PULMONARY / CRITICAL CARE MEDICINE   Name: Raymond Valdez MRN: [id number] DOB: Sep 14, 1929    ADMISSION DATE:  05/02/2016 CONSULTATION DATE:  6/7  REFERRING MD:  Dr. Waldron Labs  CHIEF COMPLAINT:  Aspiration Pneumonia and Cardiac Arrest  HISTORY OF PRESENT ILLNESS:   This is an 82 y.o. Male with a past medical history of Parkinson's disease, Hyperglycemia, Prostate Cancer s/p prostatectomy s/p radiation, Spinal stenosis, Bartonella infection, and Acute Respiratory Failure.  He presented to [facility] ER on 6/6 with c/o shortness of breath.  According to wife pt previously presented to [facility] ER with similar complaints on 6/1 in addition to cough and generalized weakness.  He was provided with nebulizers and discharged home at that time.  She noticed on 6/4 during meals he would cough and constantly clear his throat.  On 6/6 she could hear a lot of bubbling secretions in the back of his throat and he was struggling to breath.  EMS was called and upon arrival Bipap was applied.  According to wife the pt did not c/o abdominal pain, nausea, vomiting, dysuria, hematuria, urinary frequency or urgency.  He has had recent oral thrush with difficulty swallowing.  In the emergency department on 6/6 max temp 99.9 heart rate 122 and saturation level 97% on 2 L.  PCCM consulted 6/7 post cardiac arrest pt began coughing became bradycardic and then asystole ACLS protocol followed pt given 1 of epi and subsequently intubated due to family changing code status from Partial code to Full Code the code lasted a total of 5 minutes.  According to RT following intubation pt had a significant amount of purulent material from ETT during suctioning.  Pt currently in acute hypoxic respiratory failure secondary to aspiration pneumonia and worsening dysphagia secondary to oral thrush. Post CPR pt developed large left pneumothorax.    PAST MEDICAL HISTORY :  He  has a past medical history of Macular degeneration; History of  prostate cancer; Infection, bartonella; Spinal stenosis; Parkinson's disease ([place]) [id number]); Cancer ([place]); Hyperglycemia; and Acute respiratory failure ([place]).  PAST SURGICAL HISTORY: He  has past surgical history that includes Appendectomy; Tonsillectomy; Prostate surgery (1998); and Colonoscopy with propofol (N/A, 03/03/2013).  Allergies  Allergen Reactions  . Ciprofloxacin Rash  . Sulfa Antibiotics Itching and Rash  . Sulfa Drugs Cross Reactors Itching and Rash    Tribrisen, a sulfa potentiator, also causes reaction in patient, as does sulfa  . Benadryl [Diphenhydramine] Other (See Comments)    Per wife, TYLENOL PM exacerbated his Parkinson's Disease   . Tape Other (See Comments)    Most tape can tear the skin  . Tylenol [Acetaminophen] Other (See Comments)    Per wife, TYLENOL PM exacerbated his Parkinson's Disease     No current facility-administered medications on file prior to encounter.   Current Outpatient Prescriptions on File Prior to Encounter  Medication Sig  . albuterol (PROVENTIL HFA;VENTOLIN HFA) 108 (90 BASE) MCG/ACT inhaler Inhale 2 puffs into the lungs every 4 (four) hours as needed for wheezing or shortness of breath.  . Artificial Tear Solution (GENTEAL TEARS) 0.1-0.2-0.3 % SOLN Place 1-2 drops into both eyes daily as needed (for dry eyes).  . hydrocortisone cream 0.5 % Apply 1 application topically 2 (two) times daily as needed for itching.  . triamcinolone cream (KENALOG) 0.1 % Apply 1 application topically 2 (two) times daily.  . sildenafil (VIAGRA) 100 MG tablet Take 1 tablet (100 mg total) by mouth daily as needed for erectile dysfunction. (Patient not  taking: Reported on 01/05/2015)  . tadalafil (CIALIS) 5 MG tablet Take 1 tablet (5 mg total) by mouth daily as needed for erectile dysfunction. (Patient not taking: Reported on 01/05/2015)  . trimethoprim (TRIMPEX) 100 MG tablet Take 100 mg by mouth daily. Ongoing antibiotic    FAMILY HISTORY:  His has no family  status information on file.   SOCIAL HISTORY: He  reports that he quit smoking about 45 years ago. He has never used smokeless tobacco. He reports that he drinks alcohol. He reports that he does not use illicit drugs.  REVIEW OF SYSTEMS:   Unable to assess pt currently intubated   SUBJECTIVE:  Ill appearing male intubated on the vent follows simple commands and opens eyes spontaneously shakes his head no when asked if he's having pain.  VITAL SIGNS: BP 106/72 mmHg  Pulse 77  Temp(Src) 97.8 F (36.6 C) (Oral)  Resp 17  Ht 5\' 8"  (1.727 m)  Wt 179 lb 0.2 oz (81.2 kg)  BMI 27.23 kg/m2  SpO2 100%  HEMODYNAMICS:    VENTILATOR SETTINGS: Vent Mode:  [-] PSV FiO2 (%):  [40 %] 40 % Set Rate:  [16 bmp] 16 bmp Vt Set:  [550 mL] 550 mL PEEP:  [5 cmH20] 5 cmH20 Pressure Support:  [8 cmH20-10 cmH20] 10 cmH20 Plateau Pressure:  [17 cmH20] 17 cmH20  INTAKE / OUTPUT: I/O last 3 completed shifts: In: 2217 [I.V.:50.7; Other:250; NG/GT:809.6; IV Piggyback:1106.7] Out: 396 [Urine:300; Chest Tube:96]  PHYSICAL EXAMINATION: General:  Critically ill male Neuro:  Awake and follows commands, Pupils 43mm left round sluggish, 2 mm right irregular sluggish HEENT:  ETT, supple, no JVD Cardiovascular:  s1s2 sinus tach, no murmur Lungs:  Coarse diminished throughout, even, nonlabored Abdomen:  +BS x4, soft, nondistended Musculoskeletal:  Poor tone bilateral upper and lower extremities Skin:  Intact no rashes, dusky toes and buttocks   LABS:  BMET  Recent Labs Lab 05/03/16 0405 05/04/16 0300 05/05/16 0529  NA 138 141 140  K 4.0 3.9 3.1*  CL 106 111 105  CO2 25 25 26   BUN 16 19 26*  CREATININE 0.66 0.87 0.95  GLUCOSE 124* 100* 123*    Electrolytes  Recent Labs Lab 05/02/16 1831 05/03/16 0405 05/04/16 0300 05/04/16 1515 05/05/16 0529  CALCIUM  --  7.9* 8.0*  --  7.7*  MG 1.9  --   --  2.0 1.8  PHOS  --   --   --  2.0* 2.5    CBC  Recent Labs Lab 05/03/16 0405  05/04/16 0300 05/05/16 0529  WBC 14.9* 18.2* 15.6*  HGB 13.1 12.0* 11.4*  HCT 42.6 38.4* 36.2*  PLT 135* 146* 140*    Coag's No results for input(s): APTT, INR in the last 168 hours.  Sepsis Markers  Recent Labs Lab 05/03/16 1212 05/03/16 1558 05/03/16 2150  LATICACIDVEN 3.3* 3.6* 1.6    ABG  Recent Labs Lab 05/02/16 1444 05/03/16 1452 05/04/16 0347  PHART 7.392 7.269* 7.409  PCO2ART 37.4 50.1* 39.0  PO2ART 117.0* 257.0* 93.1    Liver Enzymes  Recent Labs Lab 05/02/16 1419  AST 12*  ALT 5*  ALKPHOS 57  BILITOT 1.3*  ALBUMIN 2.5*    Cardiac Enzymes No results for input(s): TROPONINI, PROBNP in the last 168 hours.  Glucose  Recent Labs Lab 05/03/16 1549 05/04/16 2025 05/04/16 2350 05/05/16 0433 05/05/16 0858  GLUCAP 131* 118* 139* 164* 139*    Imaging Dg Chest Port 1 View  05/05/2016  CLINICAL DATA:  Hypoxia.  Pneumothorax. EXAM: PORTABLE CHEST 1 VIEW COMPARISON:  May 04, 2016 FINDINGS: Small left apical pneumothorax persists without tension component. Chest drain in place on the left. Endotracheal tube tip is 2.5 cm above the carina. Nasogastric tube tip and side port are below the diaphragm. There are small pleural effusions bilaterally with patchy bibasilar atelectasis. Lungs elsewhere clear. Heart is enlarged with pulmonary vascularity within normal limits. No adenopathy. IMPRESSION: Tube and catheter positions as described. Stable small left apical pneumothorax without tension component. Bibasilar atelectasis with small pleural effusions noted. There may be a degree of congestive heart failure. Cardiomegaly is stable. No new opacity evident. No change in cardiac silhouette. Electronically Signed   By: Lowella Grip III M.D.   On: 05/05/2016 07:14     STUDIES:  CT of Head 6/6>> no acute changes generalized atrophy  CULTURES: Urine culture 6/6>> Blood culture 6/6>>negative 6/8>>  ANTIBIOTICS: Vancomycin 6/6>> Zosyn 6/6>>  SIGNIFICANT  EVENTS: 6/1-Pt presented to [facility] ER on 6/6 with c/o shortness of breath and was admitted.  According to wife pt previously presented to [facility] ER with similar complaints on 6/1 in addition to cough and generalized weakness.  He was provided with nebulizers and discharged home.   6/7-PCCM consulted post cardiac arrest on 6/7 pt began coughing became bradycardic and then asystole ACLS protocol followed pt given 1 of epi and subsequently intubated due to family changing code status from Partial code to Full Code the code lasted a total of 5 minutes.  Pt currently in acute hypoxic respiratory failure secondary to aspiration pneumonia and worsening dysphagia secondary to oral thrush 6/7-Left lateral 5th rib space chest tube placed due development of large left pneumothorax post CPR   LINES/TUBES: PIV's x2 Left lateral 5th rib space chest tube 6/7>>  DISCUSSION: This is an 82 y.o. Male presented to [facility] ER on 6/6 with c/o shortness of breath.  According to wife pt previously presented to [facility] ER with similar complaints on 6/1 in addition to cough and generalized weakness.  He was provided with nebulizers and discharged home at that time. PCCM consulted post cardiac arrest on 6/7 pt began coughing became bradycardic and then asystole ACLS protocol followed pt given 1 of epi code last 5 and subsequently intubated due to family changing code status from Partial code to Full Code the code lasted a total of 5 minutes.  Pt currently in acute hypoxic respiratory failure secondary to aspiration pneumonia and worsening dysphagia secondary to oral thrush.  ASSESSMENT / PLAN:  PULMONARY A: Acute hypoxic respiratory failure secondary to aspiration with possible pneumonia and worsening dysphagia secondary to oral thrush  Large left pneumothorax P:   Attempt SBT today Vent Bundle Left lateral 5th rib space chest tube placed Trend ABG's CXR in am   CARDIOVASCULAR A:  Cardiac Arrest  (asystole) Hypotension P:  Pt is not a candidate for hypothermia protocol due to advanced age, advanced Parkinson's disease and pts. code status changed to DNR Telemetry monitoring Maintain map >65  RENAL A:   Hypokalemia  P:   Trend BMP's Replace electrolytes as indicated  Monitor uop  GASTROINTESTINAL A:  Dysphagia  P:   PPI for PUP TF  HEMATOLOGIC A:   Hx: Prostate cancer s/p prostatectomy s/p radiation P:  Trend CBC's Lovenox for VTE prophylaxis Monitor for s/sx of bleeding Transfuse if HgB <7  INFECTIOUS A:   Aspiration Pneumonia UTI Oral Thrush Hx: Bartonella infection P:   Continue antibiotics as listed  above Trend WBC's and monitor fever curve  ENDOCRINE A:   Hyperglycemia P:   SSI CBG q4hrs Hyper/Hypoglycemic protocol as indicated   NEUROLOGIC A:   Hx: Advanced Parkinson's Disease, Spinal stenosis, and Macular degeneration P:   RASS goal: 0 Fentanly drip off for SBT Prn Fentanly for pain   FAMILY  - Updates: Currently having a family meeting to discuss of goals of treatment.  - Inter-disciplinary family meet or Palliative Care meeting due by:  May 10, 2016  Marda Stalker, [place]  Attending Note:  82 year old male with parkinson who aspirated and developed respiratory with cardiac arrest subsequently. The patient has been deteriorating with parkinson for sometime and was DNR but wife reversed it when he arrested. Patient now intubated. Continue CT to suction.  Continue abx and f/u on culture. After a long discussion, decision was made to make patient a full DNR again with no further escalation of care with focus more on comfort until son from [place] gets here then will discuss plan of care. Family meeting with two sons, daughter and wife who were informed that patient is now not weaning well and that if we place a trach/PEG then will also need a ventilator and the family relayed that they would not be able to  take care of that at home.  They would like for the patient's brother to arrive from [place] today at 1:30 PM and then will discuss timing for extubation.  Will aggressively diurese today and likely extubate in AM with hope to get patient home with home hospice.  The patient is critically ill with multiple organ systems failure and requires high complexity decision making for assessment and support, frequent evaluation and titration of therapies, application of advanced monitoring technologies and extensive interpretation of multiple databases.   Critical Care Time devoted to patient care services described in this note is 48 Minutes. This time reflects time of care of this signee Dr Jennet Maduro. This critical care time does not reflect procedure time, or teaching time or supervisory time of PA/NP/Med student/Med Resident etc but could involve care discussion time.  Rush Farmer, M.D. [facility] Pulmonary/Critical Care Medicine. Pager: [phone]. After hours pager: [phone].

## 2016-05-05 NOTE — Care Management Important Message (Signed)
Important Message  Patient Details  Name: Raymond Valdez MRN: [id number] Date of Birth: 01-24-1929   Medicare Important Message Given:  Yes    [name] Abena 05/05/2016, 10:51 AM

## 2016-05-05 NOTE — Consult Note (Signed)
Consultation Note Date: 05/05/2016   Patient Name: Raymond Valdez  DOB: [birth year]  MRN: [id number]  Age / Sex: 82 y.o., male  PCP: Vernie Shanks, MD Referring Physician: Rush Farmer, MD  Reason for Consultation: Establishing goals of care  HPI/Patient Profile: 82 y.o. male  with past medical history of Parkinson's disease, hyperglycemia, macular degeneration, and prostate cancer admitted on 05/02/2016 with respiratory failure secondary to aspiration with subsequent cardiac arrest status post CPR and currently requiring continued ventilator support. Palliative consulted for goals of care.  Clinical Assessment and Goals of Care: I met today with patient's family, including his wife, daughter, and 2 sons in conjunction with Dr. Nelda Marseille from critical care and Dr. Ronnald Ramp from residency team.  His family reports that he has always been independent man who very much values his independence and being able to live in his own home. He is of [place] descent and has held citizenship in 4 different countries.  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 Until this admission, he has been having continued decline of his nutrition, functional status, and cognition related to Parkinson's disease. He was, however, enjoying reasonably good quality of life per his family. His family has hired caregivers 16 hours per day who take him to exercise and out to restaurants in order to enjoy meals out and ice cream.      Family reports that his physicians have been doing a good job explaining things to them. We reviewed his clinical course in detail with Dr. Nelda Marseille. Since their last update, he has been had breathing trial with poor results.  We discussed what pathways forward would look like including continued ventilator support, tracheostomy, PEG tube, and placement at long-term facility.  His family is all in agreement  that this would not be his wishes if he were to understand his current clinical situation.  His family reports that their main goal is to try to work to get him home. They had been hopeful that he would recover enough to have some meaningful time at home, even if this required tracheostomy and placement of PEG tube. Now that it appears he will require long-term ventilator support, his son states that family "could probably replicate a hospital room at home, but we cannot replicate an ICU at home." The patient's wife is in agreement with this and discussing options for extubation and transition home with hospice support.  Raymond Valdez brother is a Engineer, drilling (OB/GYN) and is flying in to town from [place] later today. Family would like to meet again tomorrow morning in order to continue conversation. Our conversation today concluded with plan to work to diurese today, likely extubate tomorrow morning, and hopefully have him stabilize enough to transition home with hospice support. I answered the family's questions regarding hospice and support that could provide the best of my ability. They were in agreement with discussing options with care management to make referral to hospice agency with the hopes that he can be transitioned home shortly after withdrawal  from ventilator.  SUMMARY OF RECOMMENDATIONS   - Patient is DO NOT RESUSCITATE in the event of cardiac arrest - We discussed his prognosis at length with his family.  His entire family agrees the goal that Raymond Valdez would want is to return to his home until he dies.  He would find living in a facility with long-term ventilator support unacceptable quality of life. They understand that he is not weaning from ventilator and will likely die shortly after extubation. His brother is coming to visit from [place] and will arrive later this afternoon. We'll plan to meet again tomorrow morning for another family meeting to discuss timing of ventilator withdrawal.    - Family is interested in home hospice support with hopeful discharge tomorrow if he is stable after ventilator withdrawal.  I spoke with care management who will present options for home hospice for family to review. My understanding is that they have hired caregiving support as well as medical equipment that would be needed (including having hospital bed). Their biggest need will be the support of hospice to provide education for end-of-life care and medication management.  Code Status/Advance Care Planning:  DNR   Palliative Prophylaxis:   Aspiration, Delirium Protocol and Frequent Pain Assessment  Additional Recommendations (Limitations, Scope, Preferences):  Likely one way extubation tomorrow: goal of transitioning home for end-of-life  Psycho-social/Spiritual:   Desire for further Chaplaincy support:no  Additional Recommendations: Education on Hospice  Prognosis:   Hours - Days  Discharge Planning: Anticipated Hospital Death versus home with home hospice     Primary Diagnoses: Present on Admission:  . Acute respiratory failure ([place]) . SIRS (systemic inflammatory response syndrome) (HCC) . Hypokalemia . Macular degeneration . Parkinson's disease ([place]) . Elevated lactic acid level . Tachycardia . Thrush, oral . Respiratory distress  I have reviewed the medical record, interviewed the patient and family, and examined the patient. The following aspects are pertinent.  Past Medical History  Diagnosis Date  . Macular degeneration   . History of prostate cancer   . Infection, bartonella   . Spinal stenosis   . Parkinson's disease ([place]) 05-2012  . Cancer ([place])   . Hyperglycemia   . Acute respiratory failure [facility])    Social History   Social History  . Marital Status: Married    Spouse Name: N/A  . Number of Children: N/A  . Years of Education: N/A   Social History Main Topics  . Smoking status: Former Smoker -- 0.25 packs/day for 10 years    Quit date:  11/27/1970  . Smokeless tobacco: Never Used  . Alcohol Use: Yes  . Drug Use: No  . Sexual Activity:    Partners: Female   Other Topics Concern  . None   Social History Narrative   History reviewed. No pertinent family history. Scheduled Meds: . antiseptic oral rinse  7 mL Mouth Rinse 10 times per day  . carbidopa-levodopa  2 tablet Oral QID  . chlorhexidine gluconate (SAGE KIT)  15 mL Mouth Rinse BID  . enoxaparin (LOVENOX) injection  40 mg Subcutaneous Q24H  . famotidine (PEPCID) IV  20 mg Intravenous Q12H  . feeding supplement (PRO-STAT SUGAR FREE 64)  30 mL Per Tube BID  . fentaNYL (SUBLIMAZE) injection  50 mcg Intravenous Once  . fluconazole  100 mg Oral Daily  . furosemide  40 mg Intravenous Q6H  . insulin aspart  2-6 Units Subcutaneous Q4H  . magic mouthwash  2 mL Oral TID  . piperacillin-tazobactam (ZOSYN)  IV  3.375 g Intravenous Q8H  . potassium chloride  40 mEq Per Tube TID  . triamcinolone cream  1 application Topical BID  . vancomycin  750 mg Intravenous Q12H   Continuous Infusions: . feeding supplement (VITAL AF 1.2 CAL) 1,000 mL (05/05/16 0800)  . fentaNYL infusion INTRAVENOUS Stopped (05/05/16 0800)  . phenylephrine (NEO-SYNEPHRINE) Adult infusion Stopped (05/03/16 1600)   PRN Meds:.acetaminophen **OR** acetaminophen, bisacodyl, fentaNYL, midazolam, midazolam, ondansetron **OR** ondansetron (ZOFRAN) IV Medications Prior to Admission:  Prior to Admission medications   Medication Sig Start Date End Date Taking? Authorizing Provider  Aflibercept (EYLEA IO) Inject into the eye See admin instructions. Rotate eyes; each new injection is given every 6-8 weeks   Yes Historical Provider, MD  albuterol (PROVENTIL HFA;VENTOLIN HFA) 108 (90 BASE) MCG/ACT inhaler Inhale 2 puffs into the lungs every 4 (four) hours as needed for wheezing or shortness of breath. 01/05/15  Yes Sherwood Gambler, MD  Artificial Tear Solution (GENTEAL TEARS) 0.1-0.2-0.3 % SOLN Place 1-2 drops into  both eyes daily as needed (for dry eyes).   Yes Historical Provider, MD  Carbidopa-Levodopa ER (SINEMET CR) 25-100 MG tablet controlled release Take 2 tablets by mouth 4 (four) times daily.   Yes Historical Provider, MD  fluconazole (DIFLUCAN) 10 MG/ML suspension Swish and swallow 10 ml's by mouth once daily for 10 days 05/01/16  Yes Historical Provider, MD  fluticasone (FLONASE) 50 MCG/ACT nasal spray Place 1-2 sprays into both nostrils daily as needed for allergies or rhinitis.   Yes Historical Provider, MD  hydrocortisone cream 0.5 % Apply 1 application topically 2 (two) times daily as needed for itching.   Yes Historical Provider, MD  triamcinolone cream (KENALOG) 0.1 % Apply 1 application topically 2 (two) times daily.   Yes Historical Provider, MD  sildenafil (VIAGRA) 100 MG tablet Take 1 tablet (100 mg total) by mouth daily as needed for erectile dysfunction. Patient not taking: Reported on 01/05/2015 07/31/13   Vernie Shanks, MD  tadalafil (CIALIS) 5 MG tablet Take 1 tablet (5 mg total) by mouth daily as needed for erectile dysfunction. Patient not taking: Reported on 01/05/2015 03/19/14   Vernie Shanks, MD  trimethoprim (TRIMPEX) 100 MG tablet Take 100 mg by mouth daily. Ongoing antibiotic    Historical Provider, MD   Allergies  Allergen Reactions  . Ciprofloxacin Rash  . Sulfa Antibiotics Itching and Rash  . Sulfa Drugs Cross Reactors Itching and Rash    Tribrisen, a sulfa potentiator, also causes reaction in patient, as does sulfa  . Benadryl [Diphenhydramine] Other (See Comments)    Per wife, TYLENOL PM exacerbated his Parkinson's Disease   . Tape Other (See Comments)    Most tape can tear the skin  . Tylenol [Acetaminophen] Other (See Comments)    Per wife, TYLENOL PM exacerbated his Parkinson's Disease    Review of Systems  Unable to perform ROS: Patient nonverbal    Physical Exam  General: Elderly, ill-appearing male, no distress Neuro: Sleeping but arouses. Follows some  commands  HEENT: Intubated, supple, no JVD Cardiovascular: s1s2 sinus tach, no murmur Lungs: Coarse, diminished throughout Abdomen: +BS, soft, nondistended Skin: Intact no rashes, dusky toes  Vital Signs: BP 102/52 mmHg  Pulse 95  Temp(Src) 97.6 F (36.4 C) (Oral)  Resp 29  Ht 5' 8"  (1.727 m)  Wt 81.2 kg (179 lb 0.2 oz)  BMI 27.23 kg/m2  SpO2 93% Pain Assessment: CPOT   Pain Score: 0-No pain   SpO2: SpO2: 93 %  O2 Device:SpO2: 93 % O2 Flow Rate: .O2 Flow Rate (L/min): 3 L/min  IO: Intake/output summary:  Intake/Output Summary (Last 24 hours) at 05/05/16 1400 Last data filed at 05/05/16 1257  Gross per 24 hour  Intake 2224.65 ml  Output   1181 ml  Net 1043.65 ml    LBM: Last BM Date: 05/02/16 Baseline Weight: Weight: 79.379 kg (175 lb) Most recent weight: Weight: 81.2 kg (179 lb 0.2 oz)     Palliative Assessment/Data: PPS 10%   Flowsheet Rows        Most Recent Value   Intake Tab    Referral Department  Critical care   Unit at Time of Referral  ICU   Palliative Care Primary Diagnosis  Sepsis/Infectious Disease   Date Notified  05/04/16   Palliative Care Type  New Palliative care   Date of Admission  05/02/16   Date first seen by Palliative Care  05/04/16   # of days Palliative referral response time  0 Day(s)   # of days IP prior to Palliative referral  2   Clinical Assessment    Palliative Performance Scale Score  10%   Pain Max last 24 hours  Not able to report   Pain Min Last 24 hours  Not able to report   Psychosocial & Spiritual Assessment    Palliative Care Outcomes    Patient/Family meeting held?  Yes   Who was at the meeting?  Patient's wife, his daughter, and his 2 sons, Dr. Nelda Marseille and Dr. Ronnald Ramp   Palliative Care Outcomes  Clarified goals of care   Palliative Care follow-up planned  Yes, Home      Time In: 0950 Time Out: 1120 Time Total: 90 Greater than 50%  of this time was spent counseling and coordinating care related to the above  assessment and plan.  Signed by: Micheline Rough, MD   Please contact Palliative Medicine Team phone at [phone] for questions and concerns.  For individual provider: See Shea Evans

## 2016-05-05 NOTE — Progress Notes (Signed)
Pharmacy Antibiotic Note  Raymond Valdez is a 82 y.o. male admitted on 05/02/2016 with sepsis.  Pharmacy has been consulted for vancomycin and zosyn dosing.   His renal function has been gradually declining over the past few days- from 0.6, currently at 0.95.  Plan: -Decrease Vancomycin to 750mg  IV q12h.  Goal trough 15-20 mcg/mL. -Zosyn 3.375g IV q8h (4 hour infusion).  -Follow renal function, culture results, and GOC moving forward  Height: 5\' 8"  (172.7 cm) Weight: 179 lb 0.2 oz (81.2 kg) IBW/kg (Calculated) : 68.4  Temp (24hrs), Avg:98.6 F (37 C), Min:97.5 F (36.4 C), Max:99.1 F (37.3 C)   Recent Labs Lab 05/02/16 1419  05/02/16 1833 05/02/16 2242 05/03/16 0405 05/03/16 1212 05/03/16 1558 05/03/16 2150 05/04/16 0300 05/05/16 0529  WBC 19.0*  --   --   --  14.9*  --   --   --  18.2* 15.6*  CREATININE 0.82  --   --   --  0.66  --   --   --  0.87 0.95  LATICACIDVEN  --   < > 3.77* 2.2*  --  3.3* 3.6* 1.6  --   --   < > = values in this interval not displayed.  Estimated Creatinine Clearance: 55 mL/min (by C-G formula based on Cr of 0.95).    Allergies  Allergen Reactions  . Ciprofloxacin Rash  . Sulfa Antibiotics Itching and Rash  . Sulfa Drugs Cross Reactors Itching and Rash    Tribrisen, a sulfa potentiator, also causes reaction in patient, as does sulfa  . Benadryl [Diphenhydramine] Other (See Comments)    Per wife, TYLENOL PM exacerbated his Parkinson's Disease   . Tape Other (See Comments)    Most tape can tear the skin  . Tylenol [Acetaminophen] Other (See Comments)    Per wife, TYLENOL PM exacerbated his Parkinson's Disease     Antimicrobials this admission: vanc 6/6>>  zosyn 6/6 >>  fluconazole PTA >> (6/14)  Dose changes: 6/9: vancomycin decreased from 1g q12 to 750mg  q12  Microbiology results: 6/6 BCx x2 - ngtd 6/6 UCx - multiple species 6/6 MRSA PCR - negative   Thank you for allowing pharmacy to be a part of this patient's  care.  Diasia Henken D. Jacon Whetzel, PharmD, BCPS Clinical Pharmacist Pager: [phone] 05/05/2016 9:20 AM

## 2016-05-05 NOTE — Progress Notes (Addendum)
Spoke w dr Domingo Cocking w pal care. fam has decided once extubated that they would like to take pt home w hospice. Pt has 16hrs of private [facility] care [facility]. Gave wife-da-son list of hospice agencies in [employer]  They want to talk and will let cm know which agency they choose. Poss extub on 6-10. Will await fam decision on which home hospice agency they choose. 14:40 hrs. fam has call around and done checking. They have chosen [facility] [place]. Have made ref to [facility] [place]. A rep from [facility] [place] will get in touch w family. Poss extub and home soon thereafter on 6-10. Will cont to follow.

## 2016-05-05 NOTE — Progress Notes (Signed)
Notified by Edwin Dada of family request for [facility] [place] services at home after discharge. Chart and patient information currently under review to confirm hospice eligibility.   Spoke with family at bedside to initiate education related to hospice philosophy, services and team approach to care. Family verbalized understanding of the information provided. Per discussion, plan is for discharge to home via [facility] if patient tolerates ventilator withdrawal and is stable for transport.  Please send signed completed DNR form home with patient.  Patient will need prescriptions for discharge comfort medications.   DME needs discussed and family requested oxygen concentrator and suction for delivery to the home today.  [facility] equipment manager Jewel Ysidro Evert notified and will contact [place] to arrange delivery to the home.  The home address has been verified and is correct in the chart; Nathaneil Canary is family member to be contacted to arrange time of delivery.   [facility] aware of the above.  Completed discharge summary will need to be faxed to [facility] at [phone] when final.  Please notify [facility] when patient is ready to leave unit at discharge-call [phone].   [facility] information and contact numbers have been given to wife and brother during visit.   Please call with any questions.  Thank You,  Freddi Starr RN, [facility] Liaison  [phone]

## 2016-05-06 ENCOUNTER — Inpatient Hospital Stay (HOSPITAL_COMMUNITY): Payer: Medicare Other

## 2016-05-06 DIAGNOSIS — J9600 Acute respiratory failure, unspecified whether with hypoxia or hypercapnia: Secondary | ICD-10-CM

## 2016-05-06 LAB — CBC
HCT: 37.2 % — ABNORMAL LOW (ref 39.0–52.0)
Hemoglobin: 12.1 g/dL — ABNORMAL LOW (ref 13.0–17.0)
MCH: 29.2 pg (ref 26.0–34.0)
MCHC: 32.5 g/dL (ref 30.0–36.0)
MCV: 89.9 fL (ref 78.0–100.0)
PLATELETS: 225 10*3/uL (ref 150–400)
RBC: 4.14 MIL/uL — ABNORMAL LOW (ref 4.22–5.81)
RDW: 15.4 % (ref 11.5–15.5)
WBC: 15.3 10*3/uL — ABNORMAL HIGH (ref 4.0–10.5)

## 2016-05-06 LAB — BASIC METABOLIC PANEL
Anion gap: 9 (ref 5–15)
BUN: 33 mg/dL — AB (ref 6–20)
CALCIUM: 7.6 mg/dL — AB (ref 8.9–10.3)
CO2: 29 mmol/L (ref 22–32)
CREATININE: 1.03 mg/dL (ref 0.61–1.24)
Chloride: 103 mmol/L (ref 101–111)
GFR calc Af Amer: >60 mL/min (ref >60)
GLUCOSE: 134 mg/dL — AB (ref 65–99)
POTASSIUM: 4.4 mmol/L (ref 3.5–5.1)
Sodium: 141 mmol/L (ref 135–145)

## 2016-05-06 LAB — POCT I-STAT 3, ART BLOOD GAS (G3+)
ACID-BASE EXCESS: 8 mmol/L — AB (ref 0.0–2.0)
Bicarbonate: 33.2 mEq/L — ABNORMAL HIGH (ref 20.0–24.0)
O2 Saturation: 96 %
PH ART: 7.473 — AB (ref 7.350–7.450)
PO2 ART: 78 mmHg — AB (ref 80.0–100.0)
TCO2: 35 mmol/L (ref 0–100)
pCO2 arterial: 45 mmHg (ref 35.0–45.0)

## 2016-05-06 LAB — GLUCOSE, CAPILLARY
GLUCOSE-CAPILLARY: 139 mg/dL — AB (ref 65–99)
Glucose-Capillary: 121 mg/dL — ABNORMAL HIGH (ref 65–99)
Glucose-Capillary: 146 mg/dL — ABNORMAL HIGH (ref 65–99)

## 2016-05-06 LAB — MAGNESIUM: Magnesium: 2.3 mg/dL (ref 1.7–2.4)

## 2016-05-06 LAB — PHOSPHORUS: Phosphorus: 1.9 mg/dL — ABNORMAL LOW (ref 2.5–4.6)

## 2016-05-06 MED ORDER — CARBIDOPA-LEVODOPA 25-100 MG PO TABS: 2.0000 | ORAL_TABLET | Freq: Three times a day (TID) | ORAL | Status: DC

## 2016-05-06 MED ORDER — LORAZEPAM 2 MG/ML PO CONC: 1.0000 mg | ORAL | Status: AC | PRN

## 2016-05-06 MED ORDER — SODIUM PHOSPHATES 45 MMOLE/15ML IV SOLN
30.0000 mmol | Freq: Once | INTRAVENOUS | Status: AC
  Administered 2016-05-06: 30 mmol via INTRAVENOUS
  Filled 2016-05-06: qty 10

## 2016-05-06 MED ORDER — FUROSEMIDE 10 MG/ML IJ SOLN
40.0000 mg | Freq: Once | INTRAMUSCULAR | Status: AC
  Administered 2016-05-06: 40 mg via INTRAVENOUS
  Filled 2016-05-06: qty 4

## 2016-05-06 MED ORDER — MORPHINE SULFATE (PF) 2 MG/ML IV SOLN
2.0000 mg | INTRAVENOUS | Status: DC | PRN
  Administered 2016-05-06: 2 mg via INTRAVENOUS
  Filled 2016-05-06: qty 1

## 2016-05-06 MED ORDER — MORPHINE SULFATE (CONCENTRATE) 10 MG /0.5 ML PO SOLN: 10.0000 mg | ORAL | Status: AC | PRN

## 2016-05-06 MED ORDER — ATROPINE SULFATE 1 % OP SOLN
3.0000 [drp] | OPHTHALMIC | Status: AC | PRN
Start: 2016-05-06 — End: ?

## 2016-05-06 MED ORDER — HALOPERIDOL LACTATE 2 MG/ML PO CONC: 1.0000 mg | ORAL | Status: AC | PRN

## 2016-05-06 MED ORDER — MORPHINE SULFATE 20 MG/5ML PO SOLN: 10.0000 mg | ORAL | Status: DC | PRN

## 2016-05-06 NOTE — Discharge Summary (Signed)
Physician Discharge Summary  Patient ID: Raymond Valdez MRN: [id number] DOB/AGE: March 05, 1929 82 y.o.  Admit date: 05/02/2016 Discharge date: 05/06/2016    Discharge Diagnoses:  Principal Problem:   Acute respiratory failure ([place]) Active Problems:   Macular degeneration   Parkinson's disease ([place])   SIRS (systemic inflammatory response syndrome) (HCC)   Hypokalemia   Elevated lactic acid level   Tachycardia   Dysphagia   Thrush, oral   Sepsis ([place])   Pressure ulcer   Respiratory distress   Pneumothorax   Cardiac arrest ([place])   Acute respiratory failure with hypoxia ([place])                                                       D/c plan by Discharge Diagnosis  Acute hypoxic respiratory failure secondary to aspiration with possible pneumonia and worsening dysphagia secondary to oral thrush Large left pneumothorax - post CPR  Aspiration PNA  P:  Supportive care  Home with hospice  Place heimlich valve on chest tube for home care  Pulmonary hygiene as able  No further abx  PRN morphine    Cardiac Arrest ([place]) Hypotension P:  Supportive care  Home with hospice  meds for comfort    Dysphagia  P:  Comfort feeds   UTI  Oral Thrush Hx: Bartonella infection P:  No further abx    Advanced Parkinson's Disease, Spinal stenosis, and Macular degeneration Comfort care  P:  - Morphine Concentrate '10mg'$ /0.64m: '5mg'$  (0.280m sublingual every 1 hour as needed for pain or shortness of breath: Disp 3023m Lorazepam '2mg'$ /ml concentrated solution: '1mg'$  (0.5ml52mublingual every 4 hours as needed for anxiety: Disp 30ml14maldol '2mg'$ /ml solution: 0.'5mg'$  (0.25ml)68mlingual every 4 hours as needed for agitation or nausea: Disp 15ml  68mropine opthalmic 5ml    30mrief Summary: This is an 82 y.o. 78le with a past medical history of Parkinson's disease, Hyperglycemia, Prostate Cancer s/p prostatectomy s/p radiation, Spinal stenosis, Bartonella infection, and Acute Respiratory  Failure. [facility] with c/o shortness of breath. According to wife pt [facility] complaints on [facility]1 in addition to cough and generalized weakness. He was provided with nebulizers and discharged home at that time. She noticed on 6/4 during meals he would cough and constantly clear his throat. On 6/6 she could hear a lot of bubbling secretions in the back of his throat and he was struggling to breath. EMS was called and upon arrival Bipap was applied. According to wife the pt did not c/o abdominal pain, nausea, vomiting, dysuria, hematuria, urinary frequency or urgency. He has had recent oral thrush with difficulty swallowing. In the emergency department on 6/6 max temp 99.9 heart rate 122 and saturation level 97% on 2 L. Admitted by [facility] and on 6/7 had cardiac bradycardic arrest and PCCM consulted post cardiac arrest.  According to RT following intubation pt had a significant amount of purulent material from ETT during suctioning. Pt with acute hypoxic respiratory failure secondary to aspiration pneumonia and worsening dysphagia secondary. Post CPR pt developed large left pneumothorax.  Extensive ongoing discussions with family and palliative care service.  Pt very much valued his independence and they felt strongly about him returning home.  He was made DNR and supported on vent  for several days and made some progress with weaning.  On 6/10 after hospice arrangements were complete, pt was extubated for comfort with plans for discharge to home with hospice.     CULTURES: Urine culture 6/6>> Blood culture 6/6>>negative 6/8>>  ANTIBIOTICS: Vancomycin 6/6>>6/10 Zosyn 6/6>>6/10  SIGNIFICANT EVENTS: 6/1-Pt presented to [facility] ER on 6/6 with c/o shortness of breath and was admitted. According to wife pt previously presented to [facility] ER with similar complaints on 6/1 in addition to cough and generalized weakness. He was  provided with nebulizers and discharged home.  6/7-PCCM consulted post cardiac arrest on 6/7 pt began coughing became bradycardic and then asystole ACLS protocol followed pt given 1 of epi and subsequently intubated due to family changing code status from Partial code to Full Code the code lasted a total of 5 minutes. Pt currently in acute hypoxic respiratory failure secondary to aspiration pneumonia and worsening dysphagia secondary to oral thrush 6/7-Left lateral 5th rib space chest tube placed due development of large left pneumothorax post CPR 6/10 plan to extubate and send home with home hospice.  LINES/TUBES: PIV's x2 Left lateral 5th rib space chest tube 6/7>>    Filed Vitals:   05/06/16 1000 05/06/16 1100 05/06/16 1200 05/06/16 1300  BP:  95/52 111/81   Pulse: 84 70 86 93  Temp:      TempSrc:      Resp: 27 20 33 27  Height:      Weight:      SpO2: 100% 100% 89% 98%     Discharge Labs  BMET  Recent Labs Lab 05/02/16 1419 05/02/16 1831 05/03/16 0405 05/04/16 0300 05/04/16 1515 05/05/16 0529 05/05/16 1629 05/06/16 0239  NA 139  --  138 141  --  140  --  141  K 3.2*  --  4.0 3.9  --  3.1*  --  4.4  CL 110  --  106 111  --  105  --  103  CO2 21*  --  25 25  --  26  --  29  GLUCOSE 109*  --  124* 100*  --  123*  --  134*  BUN 16  --  16 19  --  26*  --  33*  CREATININE 0.82  --  0.66 0.87  --  0.95  --  1.03  CALCIUM 7.0*  --  7.9* 8.0*  --  7.7*  --  7.6*  MG  --  1.9  --   --  2.0 1.8 2.4 2.3  PHOS  --   --   --   --  2.0* 2.5 2.8 1.9*     CBC   Recent Labs Lab 05/04/16 0300 05/05/16 0529 05/06/16 0239  HGB 12.0* 11.4* 12.1*  HCT 38.4* 36.2* 37.2*  WBC 18.2* 15.6* 15.3*  PLT 146* 140* 225   Anti-Coagulation No results for input(s): INR in the last 168 hours.   Discharge Medications:   - Morphine Concentrate '10mg'$ /0.76m: '5mg'$  (0.233m sublingual every 1 hour as needed for pain or shortness of breath: Disp 3075m Lorazepam '2mg'$ /ml concentrated  solution: '1mg'$  (0.5ml18mublingual every 4 hours as needed for anxiety: Disp 30ml59maldol '2mg'$ /ml solution: 0.'5mg'$  (0.25ml)86mlingual every 4 hours as needed for agitation or nausea: Disp 15ml  60mropine opthalmic 5ml    29medication List    STOP taking these medications        albuterol 108 (90 Base) MCG/ACT inhaler  Commonly known  as:  PROVENTIL HFA;VENTOLIN HFA     Carbidopa-Levodopa ER 25-100 MG tablet controlled release  Commonly known as:  SINEMET CR     EYLEA IO     FLONASE 50 MCG/ACT nasal spray  Generic drug:  fluticasone     fluconazole 10 MG/ML suspension  Commonly known as:  DIFLUCAN     GENTEAL TEARS 0.1-0.2-0.3 % Soln     hydrocortisone cream 0.5 %     sildenafil 100 MG tablet  Commonly known as:  VIAGRA     tadalafil 5 MG tablet  Commonly known as:  CIALIS     triamcinolone cream 0.1 %  Commonly known as:  KENALOG     trimethoprim 100 MG tablet  Commonly known as:  TRIMPEX      TAKE these medications        atropine 1 % ophthalmic solution  Place 3 drops under the tongue every 4 (four) hours as needed (excessive secretions).     haloperidol 2 MG/ML solution  Commonly known as:  HALDOL  Take 0.5 mLs (1 mg total) by mouth every 4 (four) hours as needed for agitation (or nausea).     LORazepam 2 MG/ML concentrated solution  Commonly known as:  LORAZEPAM INTENSOL  Take 0.5 mLs (1 mg total) by mouth every 4 (four) hours as needed for anxiety.     morphine CONCENTRATE 10 mg / 0.5 ml concentrated solution  Take 0.5 mLs (10 mg total) by mouth every hour as needed (pain, shortness of breath or respiratory rate greater than 25).          Disposition: 01-Home or Self Care - with hospice   Discharged Condition: BRAYAN VOTAW has met maximum benefit of inpatient care and is medically stable and cleared for discharge.  Patient is pending follow up as above.      Time spent on disposition:  Greater than 35 minutes.   SignedNickolas Madrid,  NP 05/06/2016  2:14 PM Pager: [phone] or [phone]

## 2016-05-06 NOTE — Progress Notes (Signed)
eLink Physician-Brief Progress Note Patient Name: Raymond Valdez DOB: [birth year] MRN: [id number]   Date of Service  05/06/2016  HPI/Events of Note  Hypophosphatemia  eICU Interventions  Phos replaced     Intervention Category Intermediate Interventions: Electrolyte abnormality - evaluation and management  DETERDING,ELIZABETH 05/06/2016, 4:13 AM

## 2016-05-06 NOTE — Progress Notes (Signed)
Fentanyl drip d/c. Wasted in sink 200 mls. Witnessed by Zenovia Jordan and Orion Crook

## 2016-05-06 NOTE — Progress Notes (Signed)
CM spoke to Dr Domingo Cocking with palliative care and home hospice arranged and ready for patient to be received at home today. Cm called and spoke with Collie Siad with [facility] [place] @ [phone]. Collie Siad confirmed they will receive the patient @ home today and will have a hospice nurse there by 3:00 pm to meet with family. CM arranged [facility] transport and nurse to call when they are ready for transport. Patient has already been extubated and Hospice is arranging for oxygen for transport as patient on 2L/min via [place]. Family confirmed that patient already has bed and oxygen at home and suction to be delivered. CM delivered Medical necessity form to Nurse and no further CM needs identified.

## 2016-05-06 NOTE — Progress Notes (Signed)
Patient is discharged by [facility] to home at this time

## 2016-05-06 NOTE — Progress Notes (Signed)
Daily Progress Note   Patient Name: Raymond Valdez       Date: 05/12/2016 DOB: 14-Feb-1929  Age: 82 y.o. MRN#: [id number] Attending Physician: No att. providers found Primary Care Physician: [name], MD Admit Date: 05/02/2016  Reason for Consultation/Follow-up: Establishing goals of care  Subjective: Met this AM in conjunction with Dr. Nelda Marseille and patient's family, inclusing his wife, children, and brother.    We discussed clinical course as well as goal moving forward to be to transition home with the support of  Hospice with understanding that Raymond Valdez is approaching end-of-life and has expressed wish to be at home when he dies.   Length of Stay: 4  Physical Exam      General: Alert, awake, in no acute distress. Intubated. Does not reliably follow commands  HEENT: No bruits, no goiter, no JVD Heart: Tachycardic. No murmur appreciated. Lungs: Scattered rhonchi Abdomen: Soft, nontender, nondistended, positive bowel sounds.  Ext: No significant edema Skin: Warm and dry Neuro: Does not reliably follow commands      Vital Signs: BP 111/81 mmHg  Pulse 93  Temp(Src) 97.3 F (36.3 C) (Oral)  Resp 27  Ht 5' 8"  (1.727 m)  Wt 80.1 kg (176 lb 9.4 oz)  BMI 26.86 kg/m2  SpO2 98% SpO2: SpO2: 98 % O2 Device: O2 Device: Nasal Cannula O2 Flow Rate: O2 Flow Rate (L/min): 2 L/min  Intake/output summary: No intake or output data in the 24 hours ending 05/12/16 1221 LBM: Last BM Date: 05/02/16 Baseline Weight: Weight: 79.379 kg (175 lb) Most recent weight: Weight: 80.1 kg (176 lb 9.4 oz)       Palliative Assessment/Data:    Flowsheet Rows        Most Recent Value   Intake Tab    Referral Department  Critical care   Unit at Time of Referral  ICU   Palliative Care  Primary Diagnosis  Sepsis/Infectious Disease   Date Notified  05/04/16   Palliative Care Type  New Palliative care   Reason for referral  Clarify Goals of Care   Date of Admission  05/02/16   Date first seen by Palliative Care  05/04/16   # of days Palliative referral response time  0 Day(s)   # of days IP prior to Palliative referral  2   Clinical  Assessment    Palliative Performance Scale Score  10%   Pain Max last 24 hours  Not able to report   Pain Min Last 24 hours  Not able to report   Psychosocial & Spiritual Assessment    Palliative Care Outcomes    Patient/Family meeting held?  Yes   Who was at the meeting?  Patient's wife, his daughter, and his 2 sons, Dr. Nelda Marseille and Dr. Ronnald Ramp   Palliative Care Outcomes  Clarified goals of care   Palliative Care follow-up planned  Yes, Home   Actual Discharge Date  05/06/16 [facility] with hospice]      Patient Active Problem List   Diagnosis Date Noted  . Pressure ulcer 05/03/2016  . Respiratory distress 05/03/2016  . Pneumothorax 05/03/2016  . Cardiac arrest ([place]) 05/03/2016  . Acute respiratory failure with hypoxia ([place]) 05/03/2016  . Acute respiratory failure ([place]) 05/02/2016  . SIRS (systemic inflammatory response syndrome) ([place]) 05/02/2016  . Hypokalemia 05/02/2016  . Elevated lactic acid level 05/02/2016  . Tachycardia 05/02/2016  . Dysphagia 05/02/2016  . Thrush, oral 05/02/2016  . Sepsis ([place]) 05/02/2016  . Hyperglycemia   . Encephalopathy acute, toxic and metabolic 02/77/4128  . C1 Mass 06/13/2012  . Peripheral neuropathy, chronic. 06/12/2012  . Lower extremity weakness in a patient with a history of chronic peripheral neuropathy and mild lumbar spinal stenosis as well as Bartonella infection 06/10/2012  . Parkinson's disease ([place]) 05/27/2012  . Edema 04/08/2012  . PVC (premature ventricular contraction) 04/08/2012  . Macular degeneration 02/21/2012  . History of prostate cancer 02/21/2012  . 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 Spinal stenosis 02/21/2012    Palliative Care Assessment & Plan   Recommendations/Plan:  Plan for extubation this AM.   Transition home with hospice support as soon as patient is stable as his wish is to die at home.  On discharge, plan for the following for comfort: - Morphine Concentrate 57m/0.5ml: 557m(0.2563msublingual every 1 hour as needed for pain or shortness of breath: Disp 54m36mLorazepam 2mg/20mconcentrated solution: 1mg (7mml) s15mingual every 4 hours as needed for anxiety: Disp 54ml - 87mol 2mg/ml s27mtion: 0.5mg (0.2549m subl35mal every 4 hours as needed for agitation or nausea: Disp 15ml  - Atr52me opthalmic 5ml  I wrote55mese scripts and called to find availability for prescriptions.  Family to pick up on way home and have medications available as soon as patient arrives.   Goals of Care and Additional Recommendations:  Limitations on Scope of Treatment: Full Comfort Care  Code Status: Code Status History    Date Active Date Inactive Code Status Order ID Comments User Context   05/03/2016 12:53 PM 05/06/2016  6:51 PM DNR [id number]  [id number] Billt   05/03/2016 12:30 PM 05/03/2016 12:53 PM Full Code [id number]  [id number] Patriciat   05/02/2016  4:27 PM 05/03/2016 12:30 PM Partial Code [id number]  [id number] Gunningestions for Most Recent Historical Code Status (Order [id number])   [id number] Answer Comment   In the event of cardiac or respiratory ARREST Do not call a "code blue"    In the event of cardiac or respiratory ARREST Do not perform Intubation, CPR, defibrillation or ACLS    In the event of cardiac or respiratory ARREST Use medication by any route, position, wound care, and other measures to relive pain and suffering. May use oxygen, suction and manual treatment of airway obstruction as needed for comfort.  Advance Directive Documentation        Most Recent Value   Type of Advance Directive  Healthcare Power of  Attorney   Pre-existing out of facility DNR order (yellow form or pink MOST form)     "MOST" Form in Place?         Prognosis:   Hours - Days  Discharge Planning:  Home with Hospice  Care plan was discussed with Dr Nelda Marseille, patien family, pharmacist at [facility], and hospice liaison.  Thank you for allowing the Palliative Medicine Team to assist in the care of this patient.   Time In: 0930 Time Out: 1040 Total Time 50 Prolonged Time Billed No      Greater than 50%  of this time was spent counseling and coordinating care related to the above assessment and plan.  Micheline Rough, MD  Please contact Palliative Medicine Team phone at [phone] for questions and concerns.

## 2016-05-06 NOTE — Progress Notes (Signed)
Patient is being sent home with Hospice care, IVs removed and Chest tube to remaiin in place with Heimlich Valve and no collection atrium

## 2016-05-06 NOTE — Procedures (Signed)
Extubation Procedure Note  Patient Details:   Name: CROIX KLAVER DOB: 17-Nov-1929 MRN: [id number]   Airway Documentation:     Evaluation  O2 sats: stable throughout Complications: No apparent complications Patient did tolerate procedure well. Bilateral Breath Sounds: Diminished   No  Pt extubated per MD order. Pt placed on 2lpm [place]   Cordella Register 05/06/2016, 11:52 AM

## 2016-05-06 NOTE — Progress Notes (Signed)
PULMONARY / CRITICAL CARE MEDICINE   Name: Raymond Valdez MRN: [id number] DOB: 03/02/1929    ADMISSION DATE:  05/02/2016 CONSULTATION DATE:  6/7  REFERRING MD:  Dr. Waldron Labs  CHIEF COMPLAINT:  Aspiration Pneumonia and Cardiac Arrest  HISTORY OF PRESENT ILLNESS:   This is an 82 y.o. Male with a past medical history of Parkinson's disease, Hyperglycemia, Prostate Cancer s/p prostatectomy s/p radiation, Spinal stenosis, Bartonella infection, and Acute Respiratory Failure.  He presented to [facility] ER on 6/6 with c/o shortness of breath.  According to wife pt previously presented to [facility] ER with similar complaints on 6/1 in addition to cough and generalized weakness.  He was provided with nebulizers and discharged home at that time.  She noticed on 6/4 during meals he would cough and constantly clear his throat.  On 6/6 she could hear a lot of bubbling secretions in the back of his throat and he was struggling to breath.  EMS was called and upon arrival Bipap was applied.  According to wife the pt did not c/o abdominal pain, nausea, vomiting, dysuria, hematuria, urinary frequency or urgency.  He has had recent oral thrush with difficulty swallowing.  In the emergency department on 6/6 max temp 99.9 heart rate 122 and saturation level 97% on 2 L.  PCCM consulted 6/7 post cardiac arrest pt began coughing became bradycardic and then asystole ACLS protocol followed pt given 1 of epi and subsequently intubated due to family changing code status from Partial code to Full Code the code lasted a total of 5 minutes.  According to RT following intubation pt had a significant amount of purulent material from ETT during suctioning.  Pt currently in acute hypoxic respiratory failure secondary to aspiration pneumonia and worsening dysphagia secondary to oral thrush. Post CPR pt developed large left pneumothorax.    SUBJECTIVE:  No events overnight, weaning better this AM.  VITAL SIGNS: BP 123/88 mmHg   Pulse 84  Temp(Src) 97.3 F (36.3 C) (Oral)  Resp 27  Ht _0  (1.727 m)  Wt 80.1 kg (176 lb 9.4 oz)  BMI 26.86 kg/m2  SpO2 100%  HEMODYNAMICS:    VENTILATOR SETTINGS: Vent Mode:  [-] PSV FiO2 (%):  [40 %] 40 % Set Rate:  [16 bmp] 16 bmp Vt Set:  [550 mL] 550 mL PEEP:  [5 cmH20] 5 cmH20 Pressure Support:  [10 cmH20] 10 cmH20 Plateau Pressure:  [13 cmH20-20 cmH20] 13 cmH20  INTAKE / OUTPUT: I/O last 3 completed shifts: In: 4398.6 [I.V.:51.4; NG/GT:2430.5; IV Piggyback:1916.7] Out: 5326 [Urine:5095; Chest Tube:231]  PHYSICAL EXAMINATION: General:  Critically ill male on vent. Neuro:  Awake and follows commands, but slow. HEENT:  ETT, supple, no JVD Cardiovascular:  s1s2 sinus tach, no murmur Lungs:  Coarse diminished throughout, even, nonlabored Abdomen:  +BS x4, soft, nondistended Musculoskeletal:  Poor tone bilateral upper and lower extremities Skin:  Intact no rashes, dusky toes and buttocks   LABS:  BMET  Recent Labs Lab 05/04/16 0300 05/05/16 0529 05/06/16 0239  NA 141 140 141  K 3.9 3.1* 4.4  CL 111 105 103  CO2 _1 BUN 19 26* 33*  CREATININE 0.87 0.95 1.03  GLUCOSE 100* 123* 134*    Electrolytes  Recent Labs Lab 05/04/16 0300  05/05/16 0529 05/05/16 1629 05/06/16 0239  CALCIUM 8.0*  --  7.7*  --  7.6*  MG  --   < > 1.8 2.4 2.3  PHOS  --   < > 2.5  2.8 1.9*  < > = values in this interval not displayed.  CBC  Recent Labs Lab 05/04/16 0300 05/05/16 0529 05/06/16 0239  WBC 18.2* 15.6* 15.3*  HGB 12.0* 11.4* 12.1*  HCT 38.4* 36.2* 37.2*  PLT 146* 140* 225   Coag's No results for input(s): APTT, INR in the last 168 hours.  Sepsis Markers  Recent Labs Lab 05/03/16 1212 05/03/16 1558 05/03/16 2150  LATICACIDVEN 3.3* 3.6* 1.6   ABG  Recent Labs Lab 05/03/16 1452 05/04/16 0347 05/06/16 0505  PHART 7.269* 7.409 7.473*  PCO2ART 50.1* 39.0 45.0  PO2ART 257.0* 93.1 78.0*   Liver Enzymes  Recent Labs Lab  05/02/16 1419  AST 12*  ALT 5*  ALKPHOS 57  BILITOT 1.3*  ALBUMIN 2.5*   Cardiac Enzymes No results for input(s): TROPONINI, PROBNP in the last 168 hours.  Glucose  Recent Labs Lab 05/05/16 1200 05/05/16 1606 05/05/16 2007 05/05/16 2354 05/06/16 0423 05/06/16 0817  GLUCAP 138* 144* 141* 146* 121* 139*    Imaging Dg Chest Port 1 View  05/06/2016  CLINICAL DATA:  82 year old male with a history of intubation EXAM: PORTABLE CHEST 1 VIEW COMPARISON:  05/05/2016, 05/04/2016, 05/03/2016 FINDINGS: Cardiomediastinal silhouette unchanged in size and contour, partially obscured by overlying lung/ pleural disease. Unchanged left-sided pigtail thoracostomy tube. No visualized pneumothorax. Opacity at the bilateral bases with blunting of the costophrenic angles. Opacity in the right hilar region and infrahilar region. Unchanged position of the tracheostomy tube, which terminates approximately 4.2 cm above the carina. Unchanged gastric tube terminating out of the field of view. IMPRESSION: Unchanged left-sided pigtail thoracostomy tube, with no visualized pneumothorax. Bilateral pleural effusions with associated atelectasis/consolidation. Unchanged endotracheal tube and gastric tube. Signed, Dulcy Fanny. Earleen Newport, DO [facility] Radiology Electronically Signed   By: Corrie Mckusick D.O.   On: 05/06/2016 08:26   STUDIES:  CT of Head 6/6>> no acute changes generalized atrophy  CULTURES: Urine culture 6/6>> Blood culture 6/6>>negative 6/8>>  ANTIBIOTICS: Vancomycin 6/6>> Zosyn 6/6>>  SIGNIFICANT EVENTS: 6/1-Pt presented to [facility] ER on 6/6 with c/o shortness of breath and was admitted.  According to wife pt previously presented to [facility] ER with similar complaints on 6/1 in addition to cough and generalized weakness.  He was provided with nebulizers and discharged home.   6/7-PCCM consulted post cardiac arrest on 6/7 pt began coughing became  bradycardic and then asystole ACLS protocol followed pt given 1 of epi and subsequently intubated due to family changing code status from Partial code to Full Code the code lasted a total of 5 minutes.  Pt currently in acute hypoxic respiratory failure secondary to aspiration pneumonia and worsening dysphagia secondary to oral thrush 6/7-Left lateral 5th rib space chest tube placed due development of large left pneumothorax post CPR 6/10 plan to extubate and send home with home hospice.  LINES/TUBES: PIV's x2 Left lateral 5th rib space chest tube 6/7>>  DISCUSSION: This is an 82 y.o. Male presented to [facility] ER on 6/6 with c/o shortness of breath.  According to wife pt previously presented to [facility] ER with similar complaints on 6/1 in addition to cough and generalized weakness.  He was provided with nebulizers and discharged home at that time. PCCM consulted post cardiac arrest on 6/7 pt began coughing became bradycardic and then asystole ACLS protocol followed pt given 1 of epi code last 5 and subsequently intubated due to family changing code status from Partial code to Full Code the  code lasted a total of 5 minutes.  Pt currently in acute hypoxic respiratory failure secondary to aspiration pneumonia and worsening dysphagia secondary to oral thrush.  ASSESSMENT / PLAN:  PULMONARY A: Acute hypoxic respiratory failure secondary to aspiration with possible pneumonia and worsening dysphagia secondary to oral thrush  Large left pneumothorax P:   Once family is ready will extubate today Vent Bundle Place heimlich valve on chest tube in preparation for extubation. Trend ABG's CXR in am   CARDIOVASCULAR A:  Cardiac Arrest (asystole) Hypotension P:  DNR status Telemetry monitoring Maintain map >65  RENAL A:   Hypokalemia  P:   Trend BMP's Replace electrolytes as indicated  Monitor uop  GASTROINTESTINAL A:  Dysphagia  P:   PPI for PUP TF  HEMATOLOGIC A:   Hx:  Prostate cancer s/p prostatectomy s/p radiation P:  Trend CBC's Lovenox for VTE prophylaxis Monitor for s/sx of bleeding Transfuse if HgB <7  INFECTIOUS A:   Aspiration Pneumonia UTI Oral Thrush Hx: Bartonella infection P:   Continue antibiotics as listed above Trend WBC's and monitor fever curve  ENDOCRINE A:   Hyperglycemia P:   SSI CBG q4hrs Hyper/Hypoglycemic protocol as indicated   NEUROLOGIC A:   Hx: Advanced Parkinson's Disease, Spinal stenosis, and Macular degeneration P:   RASS goal: 0 Fentanly drip off for SBT Prn Fentanly for pain  FAMILY  - Updates: Met with family, palliative care MD and hospice RN, plan is once they have all equipment needed then will extubate, watch for an hour then send home with hospice.  - Inter-disciplinary family meet or Palliative Care meeting due by:  May 10, 2016  The patient is critically ill with multiple organ systems failure and requires high complexity decision making for assessment and support, frequent evaluation and titration of therapies, application of advanced monitoring technologies and extensive interpretation of multiple databases.   Critical Care Time devoted to patient care services described in this note is 35 Minutes. This time reflects time of care of this signee Dr Jennet Maduro. This critical care time does not reflect procedure time, or teaching time or supervisory time of PA/NP/Med student/Med Resident etc but could involve care discussion time.  Rush Farmer, M.D. [facility] Pulmonary/Critical Care Medicine. Pager: [phone]. After hours pager: [phone].

## 2016-05-07 LAB — CULTURE, BLOOD (ROUTINE X 2)
CULTURE: NO GROWTH
Culture: NO GROWTH

## 2016-05-11 ENCOUNTER — Ambulatory Visit: Payer: Medicare Other | Admitting: Diagnostic Neuroimaging

## 2016-05-27 DEATH — deceased

## 2016-12-17 IMAGING — CR DG CHEST 2V
2 series · 2 of 2 positions shown · non-contrast
Comparison: 01/05/2015

CLINICAL DATA: Fever today.  Difficulty swallowing for 1 week.

EXAM:
CHEST  2 VIEW

[x chest ap]
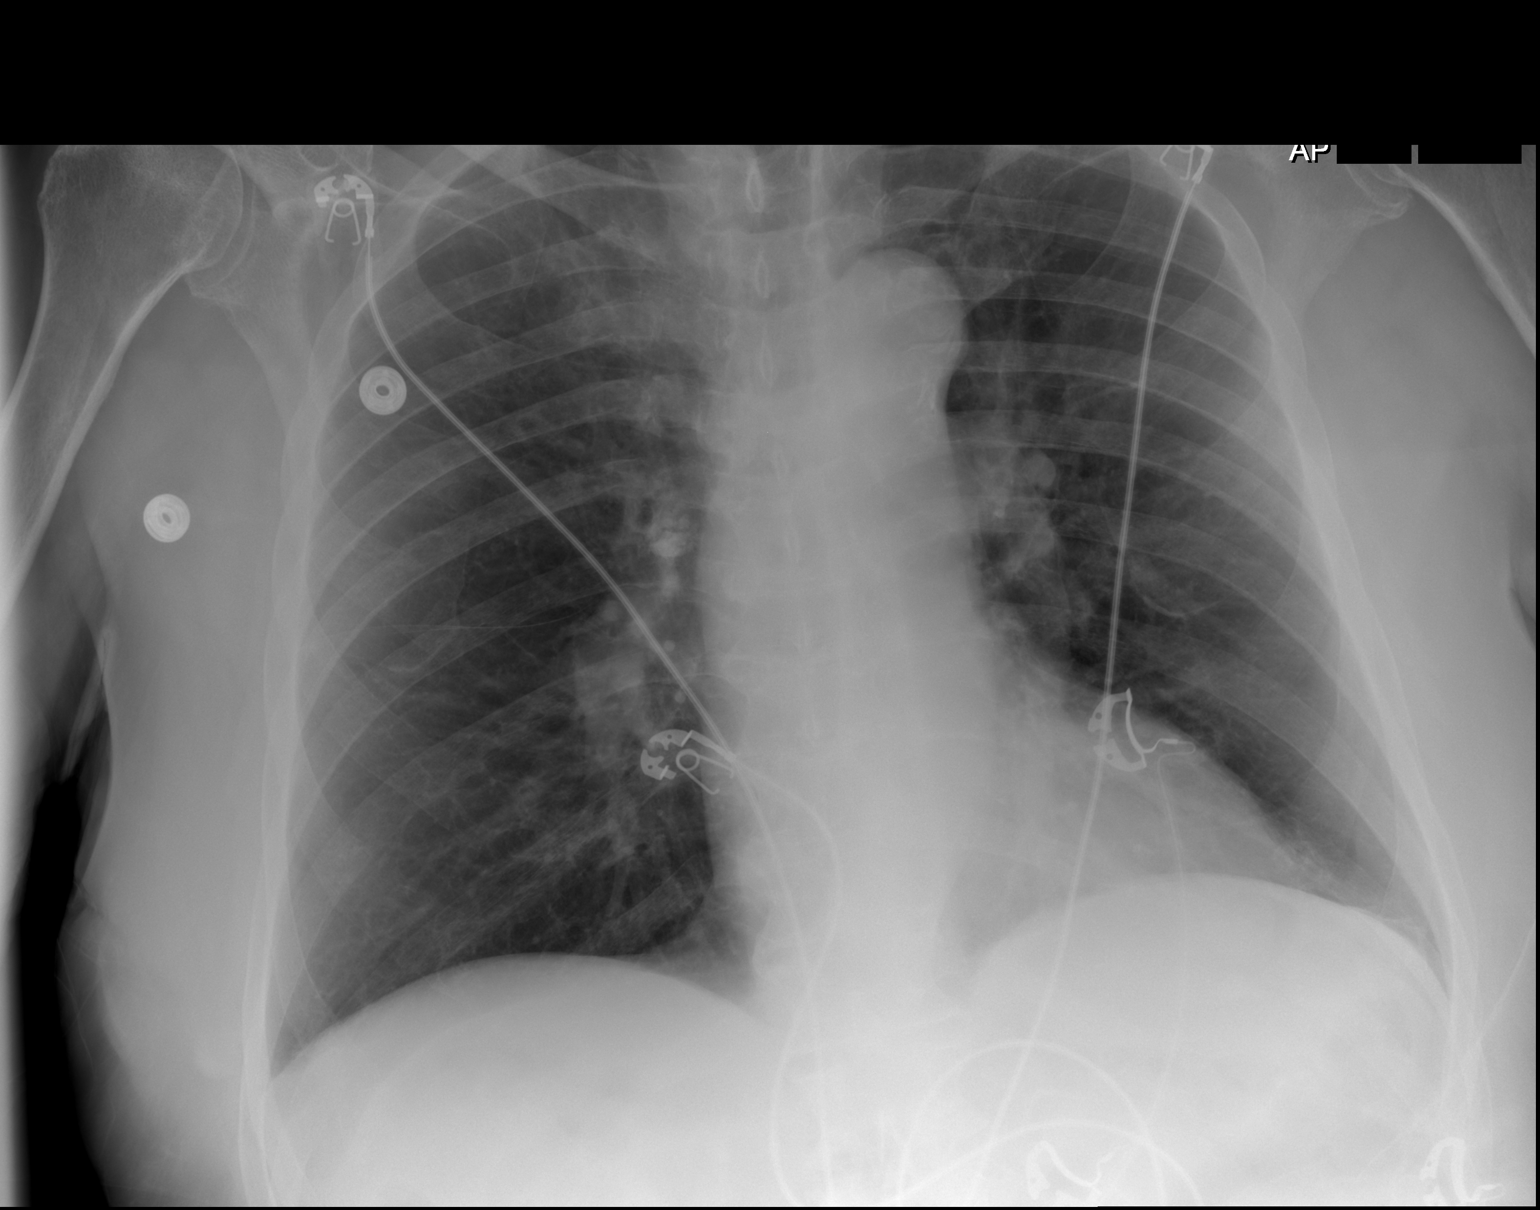

[w chest lat]
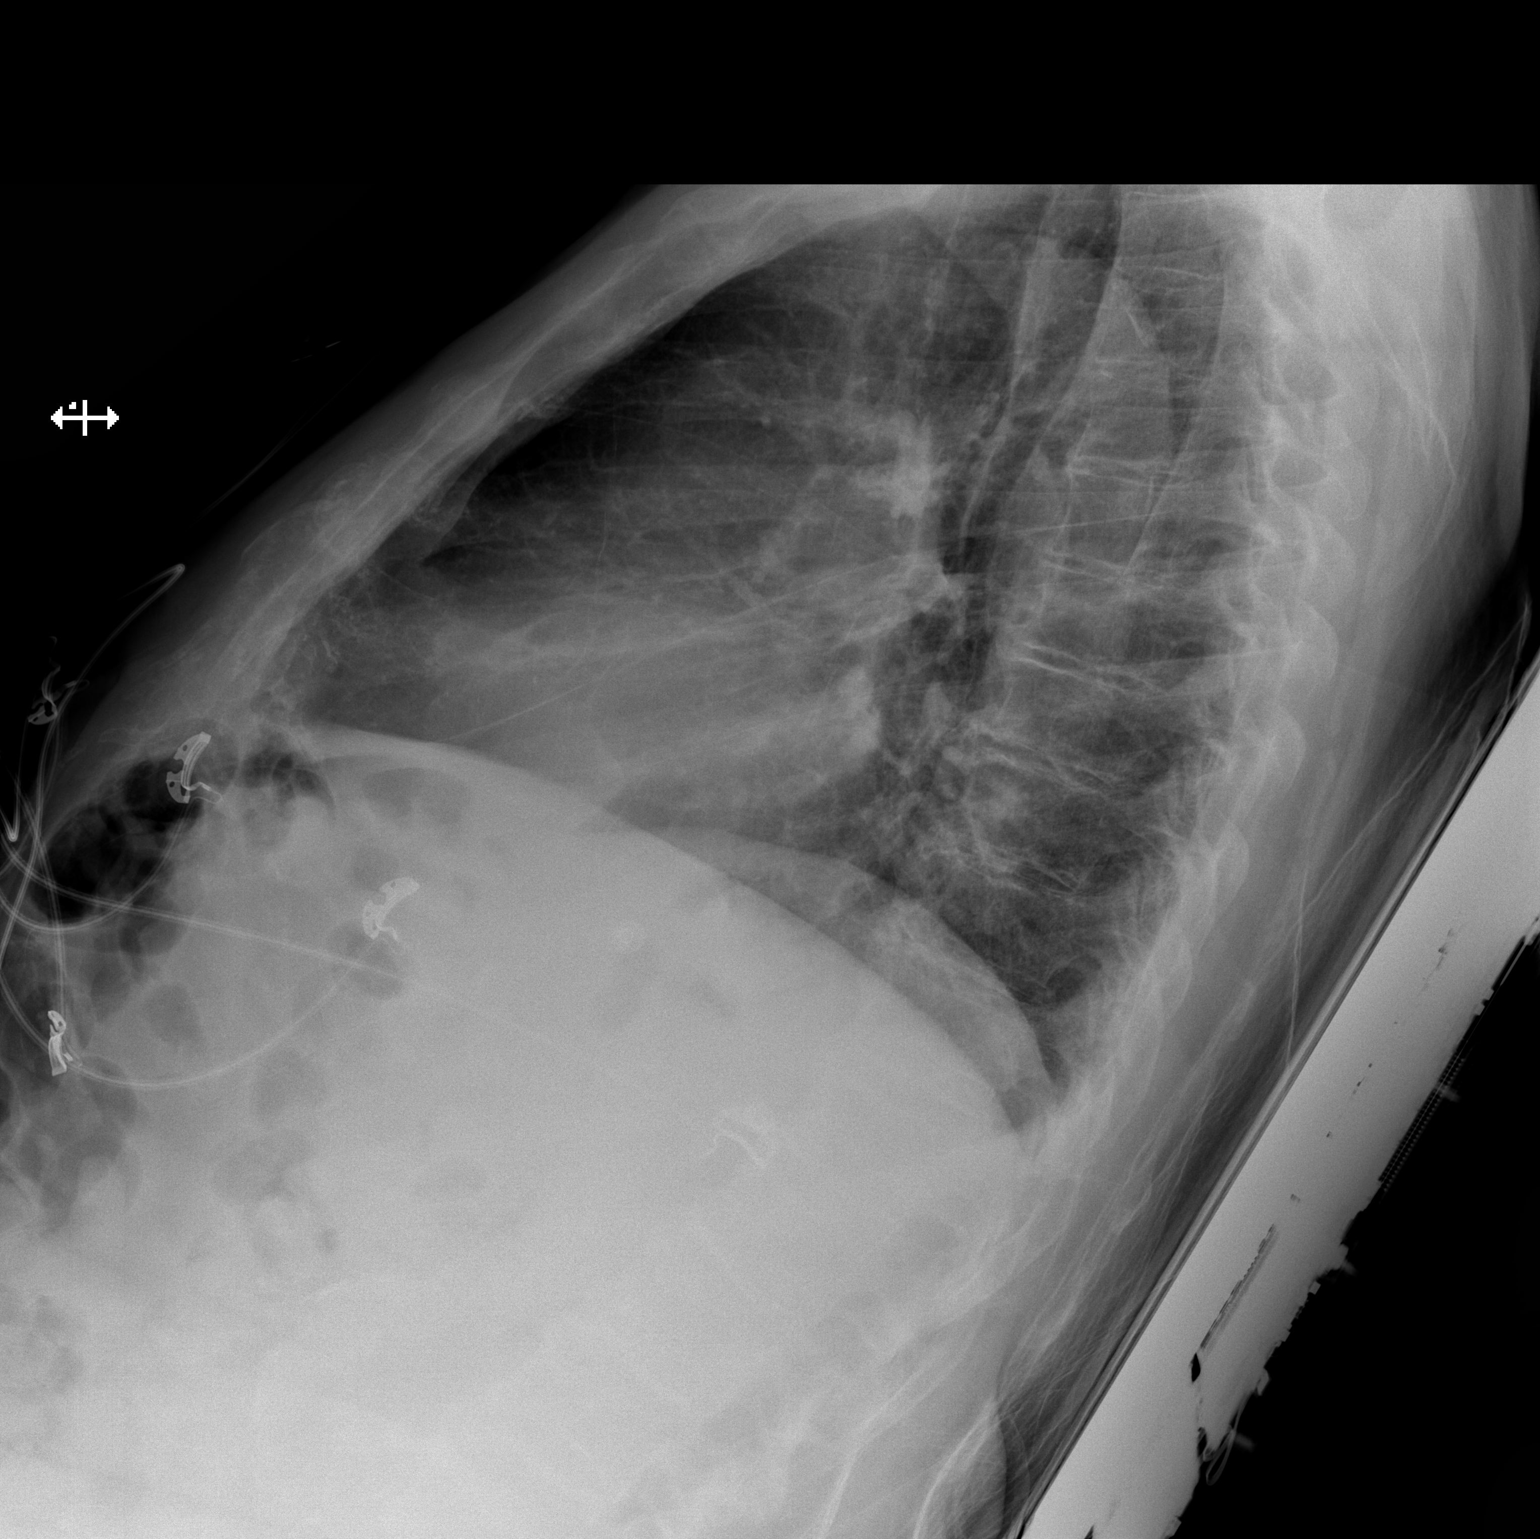

[2 of 2 positions shown; findings below may reference images not displayed]

FINDINGS: Cardiomediastinal silhouette is normal. Mediastinal contours appear
intact. Calcified atherosclerotic disease and tortuosity of the
aorta are noted.

There is no evidence of focal airspace consolidation, pleural
effusion or pneumothorax.

Osseous structures are without acute abnormality. Soft tissues are
grossly normal.
IMPRESSION: No active cardiopulmonary disease.
